# Patient Record
Sex: Male | Born: 1960 | Race: Black or African American | Hispanic: No | Marital: Single | Smoking: Current some day smoker
Health system: Southern US, Community
[De-identification: ages and names within clinical notes are randomized; demographics above are authoritative.]

## PROBLEM LIST (undated history)

## (undated) DIAGNOSIS — Z972 Presence of dental prosthetic device (complete) (partial): Secondary | ICD-10-CM

## (undated) DIAGNOSIS — K219 Gastro-esophageal reflux disease without esophagitis: Secondary | ICD-10-CM

## (undated) HISTORY — DX: Gastro-esophageal reflux disease without esophagitis: K21.9

## (undated) HISTORY — PX: ESOPHAGOGASTRODUODENOSCOPY: SHX1529

## (undated) HISTORY — PX: HERNIA REPAIR: SHX51

---

## 2000-10-14 ENCOUNTER — Encounter: Payer: Self-pay | Admitting: *Deleted

## 2000-10-14 ENCOUNTER — Inpatient Hospital Stay (HOSPITAL_COMMUNITY): Admission: AD | Admit: 2000-10-14 | Discharge: 2000-10-18 | Payer: Self-pay | Admitting: *Deleted

## 2000-10-15 ENCOUNTER — Encounter: Payer: Self-pay | Admitting: *Deleted

## 2001-11-22 ENCOUNTER — Emergency Department (HOSPITAL_COMMUNITY): Admission: EM | Admit: 2001-11-22 | Discharge: 2001-11-22 | Payer: Self-pay

## 2003-09-27 ENCOUNTER — Ambulatory Visit (HOSPITAL_COMMUNITY): Admission: RE | Admit: 2003-09-27 | Discharge: 2003-09-27 | Payer: Self-pay | Admitting: Pulmonary Disease

## 2004-05-05 ENCOUNTER — Ambulatory Visit: Payer: Self-pay | Admitting: Family Medicine

## 2004-05-09 ENCOUNTER — Ambulatory Visit (HOSPITAL_COMMUNITY): Admission: RE | Admit: 2004-05-09 | Discharge: 2004-05-09 | Payer: Self-pay | Admitting: Family Medicine

## 2004-08-20 ENCOUNTER — Ambulatory Visit: Payer: Self-pay | Admitting: Family Medicine

## 2005-04-20 ENCOUNTER — Ambulatory Visit: Payer: Self-pay | Admitting: Gastroenterology

## 2005-05-14 IMAGING — US US ABDOMEN COMPLETE
1 series · 14 of 25 positions shown · non-contrast
Comparison: none

HISTORY: Abdominal pain, nausea

ULTRASOUND ABDOMEN COMPLETE:
Gallbladder normal without stones or wall thickening.
Common bile duct normal caliber, 4 mm diameter.
Liver and spleen normal appearance.
Pancreas obscured by bowel gas.
Kidneys normal appearance, right 10.4 cm length and left 10.7 cm.
Aorta and IVC normal.
No ascites.

[Series 1: unknown · 0.40mm/px · 14 of 64 slices shown]
[im 1/64]
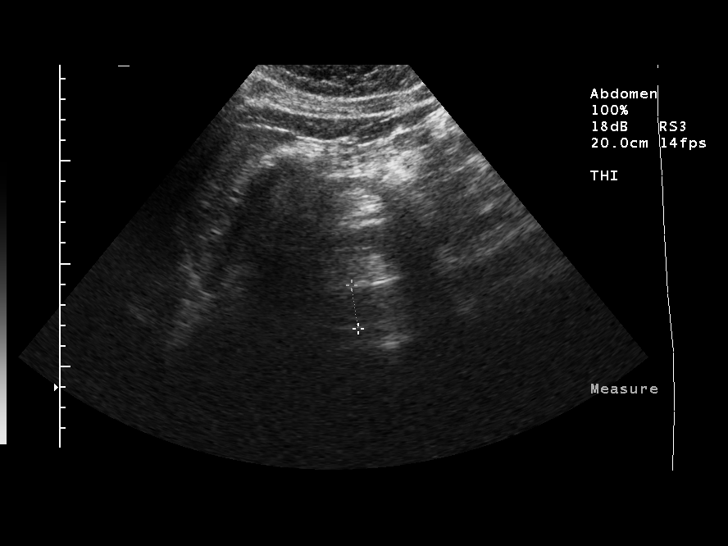
[im 6/64]
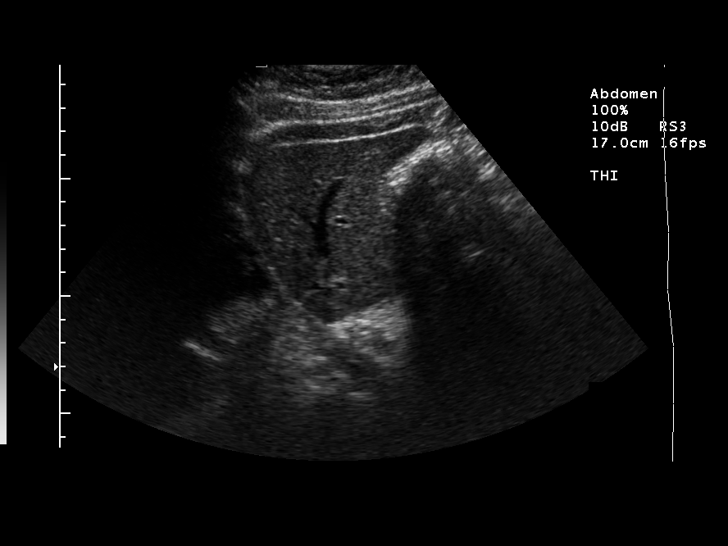
[im 11/64]
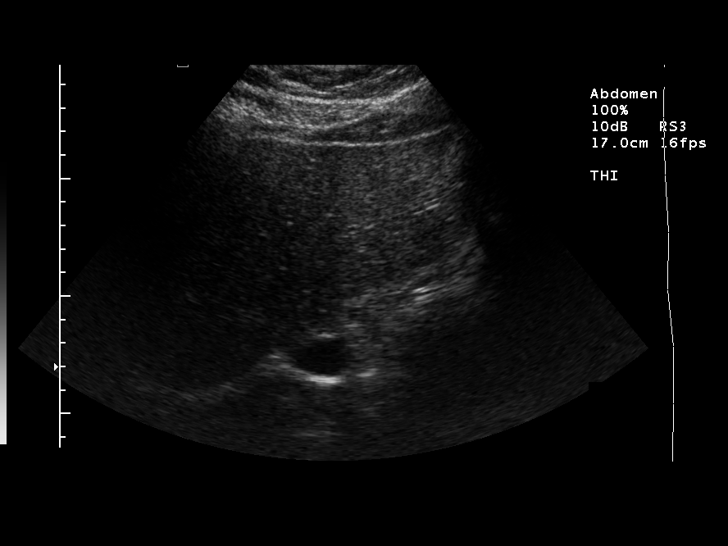
[im 16/64]
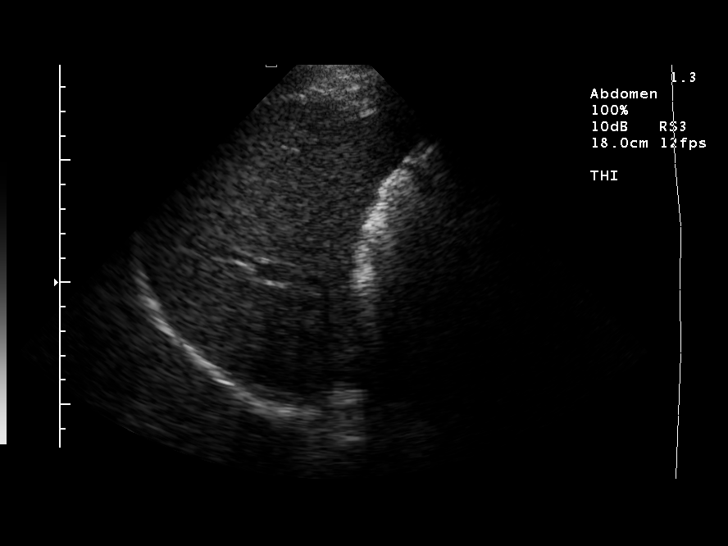
[im 22/64]
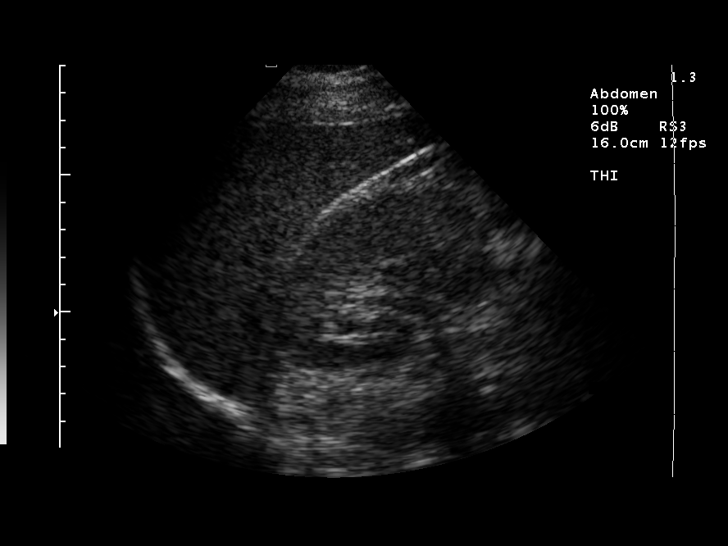
[im 24/64]
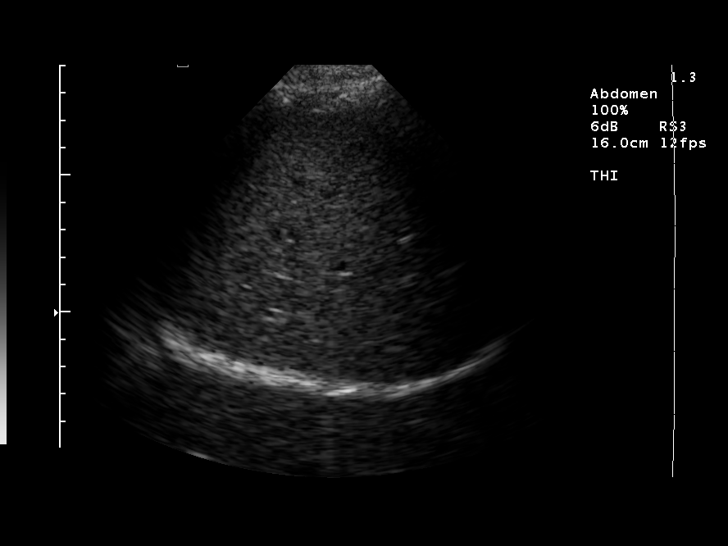
[im 29/64]
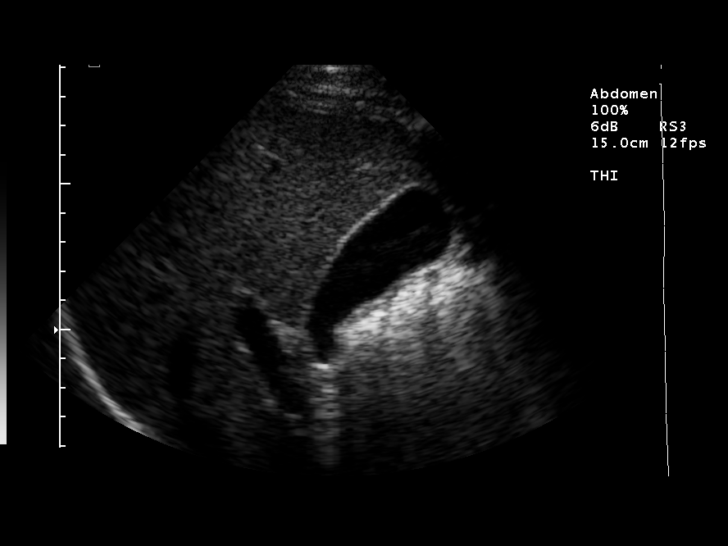
[im 35/64]
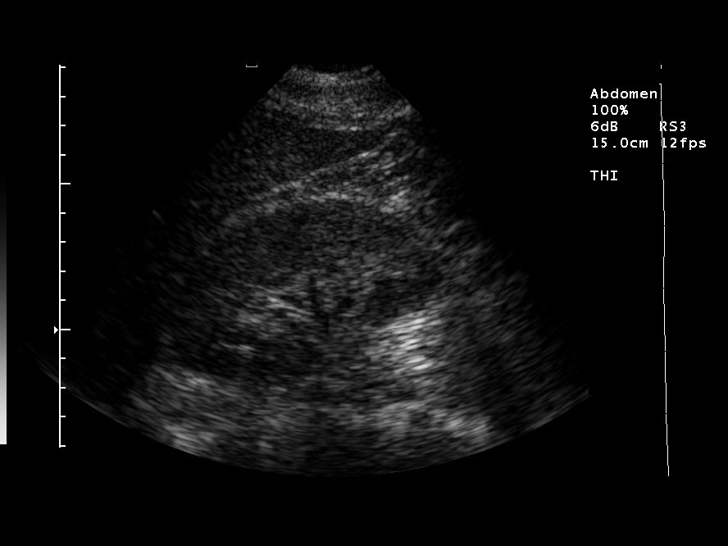
[im 40/64]
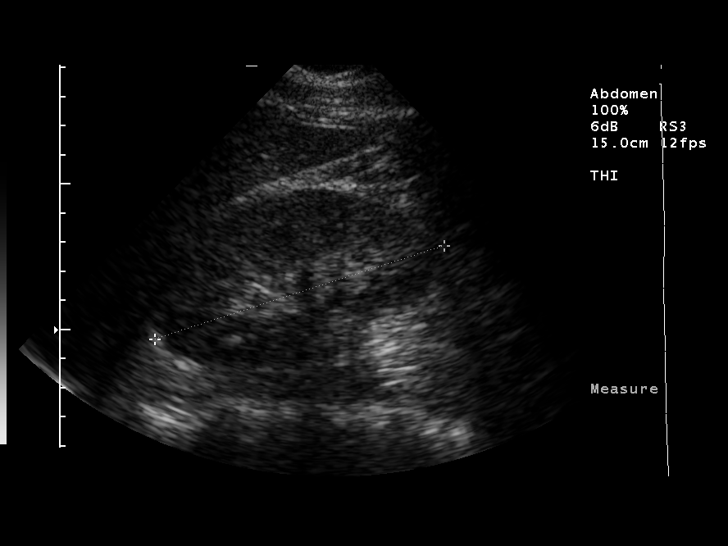
[im 43/64]
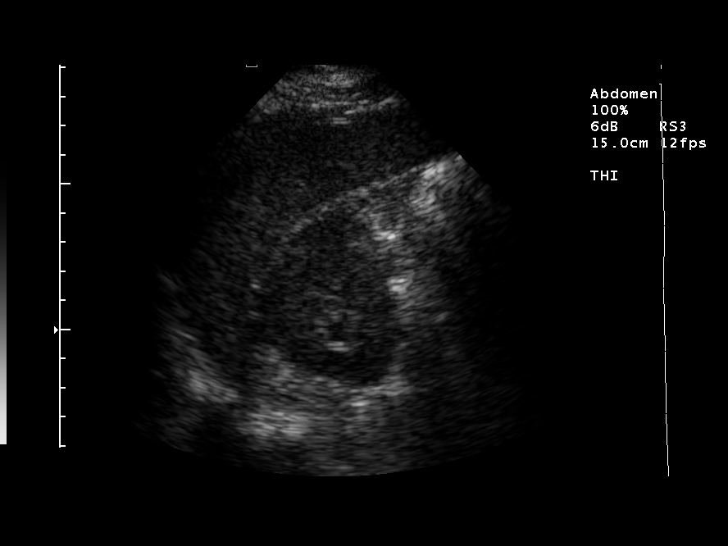
[im 48/64]
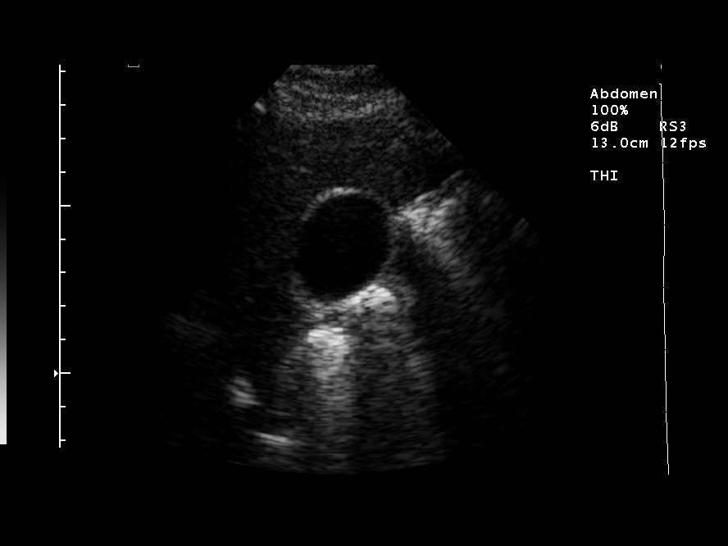
[im 53/64]
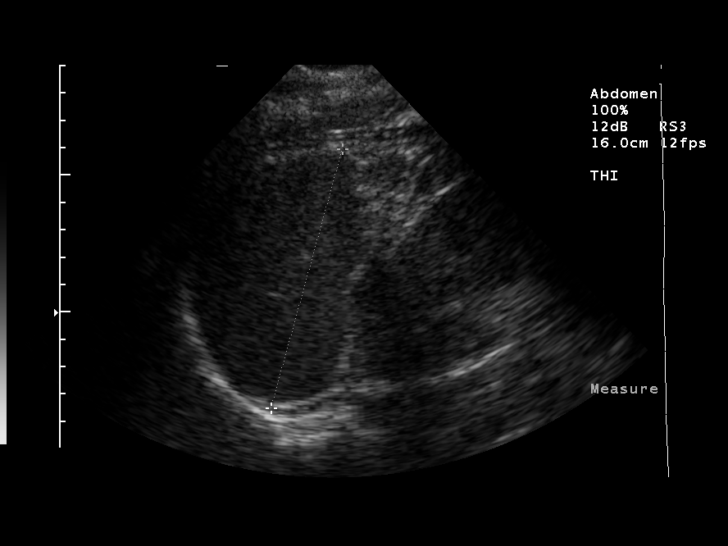
[im 58/64]
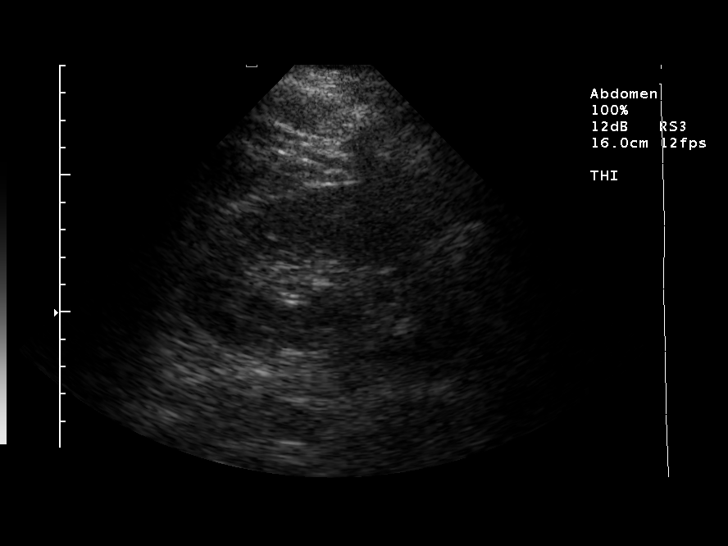
[im 64/64]
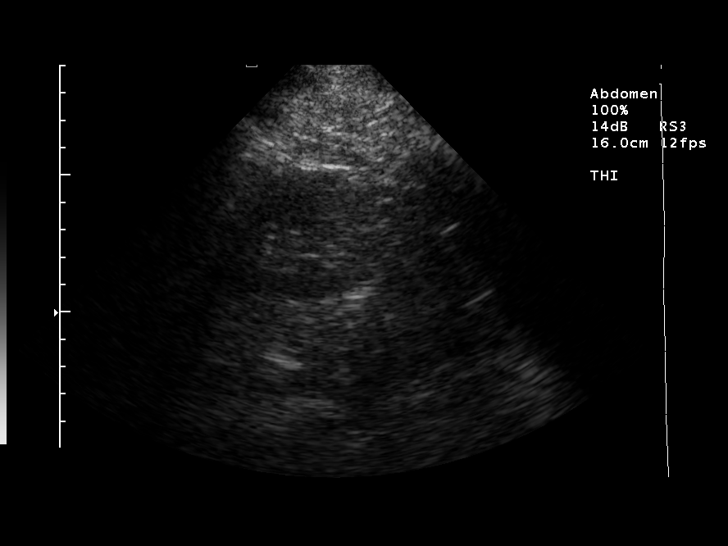

[14 of 25 positions shown; findings below may reference images not displayed]

IMPRESSION: Normal ultrasound abdomen.

## 2006-03-30 ENCOUNTER — Ambulatory Visit: Payer: Self-pay | Admitting: Family Medicine

## 2007-06-09 ENCOUNTER — Encounter: Payer: Self-pay | Admitting: Family Medicine

## 2008-04-20 ENCOUNTER — Encounter: Payer: Self-pay | Admitting: Family Medicine

## 2008-05-22 ENCOUNTER — Ambulatory Visit: Payer: Self-pay | Admitting: Family Medicine

## 2008-05-22 LAB — CONVERTED CEMR LAB: OCCULT 1: NEGATIVE

## 2008-05-23 DIAGNOSIS — E669 Obesity, unspecified: Secondary | ICD-10-CM | POA: Insufficient documentation

## 2008-05-23 DIAGNOSIS — B353 Tinea pedis: Secondary | ICD-10-CM | POA: Insufficient documentation

## 2008-05-23 DIAGNOSIS — F528 Other sexual dysfunction not due to a substance or known physiological condition: Secondary | ICD-10-CM | POA: Insufficient documentation

## 2008-05-23 LAB — CONVERTED CEMR LAB
BUN: 16 mg/dL (ref 6–23)
Calcium: 8.9 mg/dL (ref 8.4–10.5)
Cholesterol: 209 mg/dL — ABNORMAL HIGH (ref 0–200)
Creatinine, Ser: 1.22 mg/dL (ref 0.40–1.50)
Eosinophils Absolute: 0.1 10*3/uL (ref 0.0–0.7)
Eosinophils Relative: 2 % (ref 0–5)
Glucose, Bld: 75 mg/dL (ref 70–99)
HCT: 44.5 % (ref 39.0–52.0)
Hemoglobin: 15.1 g/dL (ref 13.0–17.0)
Lymphs Abs: 2.2 10*3/uL (ref 0.7–4.0)
MCV: 86.6 fL (ref 78.0–100.0)
Monocytes Absolute: 0.5 10*3/uL (ref 0.1–1.0)
PSA: 0.52 ng/mL (ref 0.10–4.00)
Platelets: 236 10*3/uL (ref 150–400)
Sodium: 139 meq/L (ref 135–145)
TSH: 1.85 microintl units/mL (ref 0.350–4.50)
Total CHOL/HDL Ratio: 4.4
WBC: 6.2 10*3/uL (ref 4.0–10.5)

## 2010-06-28 ENCOUNTER — Encounter: Payer: Self-pay | Admitting: Family Medicine

## 2010-06-29 ENCOUNTER — Encounter: Payer: Self-pay | Admitting: Family Medicine

## 2010-07-08 NOTE — Letter (Signed)
Summary: Historic Patient File  Historic Patient File   Imported By: Lind Guest 12/11/2009 13:44:01  _____________________________________________________________________  External Attachment:    Type:   Image     Comment:   External Document

## 2010-10-24 NOTE — Discharge Summary (Signed)
[place]  Patient:    Curtis Rowe, Curtis Rowe                        MRN: [id number] Adm. Date:  [id number] Disc. Date: [id number] Attending:  Glennon Hamilton Dictator:   Joellyn Rued, P.A.-C. CC:         Kari Baars, M.D.   Referring Physician Discharge [place]  DATE OF BIRTH:  [birth year]  ATTENDING PHYSICIAN:  Dr. Luis Abed.  BRIEF HISTORY:  Mr. Curtis Rowe is a 50 year old black male who was admitted from home after being referred from his primary M.D.  He describes a three-week history of a cough which has been nonproductive.  He has not had any fevers or chills and saw his primary care doctor for the previously mentioned symptoms; he was placed on antibiotic for 10 days with little improvement.  He also describes a five-week history of chest discomfort.  He describes this as left-sided and sharp without radiation.  It has occurred at work and [employer].  It seems to improve with activity and it only lasts a few seconds.  He may not have it at all during the day, or he may have it three times during the day.  He denies any associated shortness of breath, nausea, vomiting, diaphoresis.  He gives it a scale of 1-2/0-10, nor is it pleuritic.  He has not had any injuries.  He denies any exertional symptoms, fatigue, orthopnea, PND, edema, palpitations, syncope, GI or GU problems, weight loss, decreased appetite.  He does have occasional indigestion which is relieved with TUMS.  LABORATORY DATA:  Fasting lipids show a total cholesterol of 229, triglycerides 63, HDL 58, LDL 158.  CKs and troponins were negative for myocardial infarction.  Sodium was 136, potassium 3.6, BUN 14, creatinine 1.2. Normal LFTs.  H&H were 14.8 and 42.5, normal indices, platelets 198,000, WBC 9.5.  PT 13.1 and PTT 26.  Chest x-ray did not show any active disease.  EKG shows normal sinus rhythm, nonspecific ST-T wave changes.  Echocardiogram showed an EF of 55-65%, no  wall motion abnormalities.  Right ventricular size was the upper limits of normal.  HOSPITAL COURSE:  Mr. Curtis Rowe was admitted to 3700.  It was felt that his symptoms were atypical.  Overnight, he did not have any further discomfort. Enzymes and EKGs were negative for myocardial infarction.  A stress Cardiolite was performed.  He ambulated into stage IV and achieved greater than an 85% predicted maximum heart rate.  The test was stopped due to fatigue.  He did not have any chest discomfort.  He did not have any EKG changes.  Imaging showed an EF of 57%, inferoseptal ischemia.  Dr. Madolyn Frieze. Crenshaw reviewed imaging and agreed with the radiologists interpretation.  Cardiac catheterization was recommended.  Catheterization performed on Oct 18, 2000 did not show any evidence of coronary artery disease.  His ejection fraction was 50%.  Dr. Everardo Beals. Juanda Chance felt that his symptoms were not cardiac but probably related to his cough and that he had had a false-positive stress test.  Post sheath removal and bedrest, he was ambulating in the halls without difficulty and catheterization site was intact, thus, he was discharged home.  DISCHARGE DIAGNOSES: 1. Chest discomfort of undetermined etiology.  Cardiac catheterization did not    show any coronary artery disease and he has normal left ventricular    function. 2. Hyperlipidemia. 3. Persistent cough.  DISPOSITION:  He is discharged home.  DISCHARGE MEDICATIONS:  We are giving him a new prescription for Protonix 40 mg q.d. and Humibid LA 600 mg b.i.d.  ACTIVITY:  He was advised no lifting, driving, sexual activity or heavy exertion for two days.  DIET:  Maintain low-salt/-fat/-cholesterol diet.  WOUND CARE:  If he has any problems with his catheterization site, he was asked to call us immediately.  FOLLOWUP:  He will arrange a followup appointment with Dr. Kari Baars, who will determine if he needs to be on a lipid agent or if he can  correct his hyperlipidemia with diet. DD:  10/18/00 TD:  10/18/00 Job: [id number]

## 2010-10-24 NOTE — Cardiovascular Report (Signed)
[place]. [facility]  Patient:    Curtis Rowe, Curtis Rowe                        MRN: [id number] Proc. Date: 10/18/00 Adm. Date:  [id number] Attending:  Glennon Hamilton CC:         Cardiac Catheterization Laboatory  Kari Baars, M.D.  Cecil Cranker, M.D. [facility]   Cardiac Catheterization  PROCEDURE:  Cardiac catheterization.  CARDIOLOGISTEverardo Beals Juanda Chance, M.D.  INDICATIONS:  Curtis Rowe is 50 years old and has had recent symptoms of an upper respiratory infection with cough.  He developed left-sided chest pain and was seen by Dr. Kari Baars, and referred to [place] for further evaluation. Dr. Cecil Cranker saw him in consultation here, and thought his pain was atypical for ischemia, but ordered an echocardiogram and a stress Cardiolite. The stress Cardiolite was abnormal with inferoseptal ischemia, and so he was scheduled for a cardiac catheterization.  His echocardiogram was normal.  DESCRIPTION OF PROCEDURE:  The procedure was performed via the right femoral artery and arterial sheath and 6-French preformed coronary catheters.  A frontal arterial puncture was performed, and Omnipaque contrast was used.  The patient tolerated the procedure well and left the laboratory in satisfactory condition.  RESULTS: PRESSURES: Aortic pressure:  114/72, with a mean of 91. Left ventricular pressure:  114/11.  1. Left main coronary artery:  The left main coronary artery was free    of significant disease. 2. Left anterior descending coronary artery:  The left anterior descending    coronary artery gave rise to two diagonal branches and four septal    perforators.  These the LAD proper were free of significant disease. 3. Circumflex coronary artery:  The circumflex coronary artery gave rise    to an intermediate branch, a marginal branch, and two posterolateral    branches.  These vessels were free of significant disease. 4. Right coronary artery:  The right coronary  artery is a moderate-sized    vessel and gave rise to a conus branch, right ventricular branch, a    posterior descending branch, and a small posterolateral branch.  This    vessel was free of significant disease.  LEFT VENTRICULOGRAM:  The left ventriculogram performed in the RAO projection showed mild global hypokinesis with an estimated ejection fraction of 50%.  CONCLUSION:  Normal coronary angiography and left ventricular wall motion.  RECOMMENDATIONS:  Reassurance.  In view of these findings, I suspect his symptoms are probably muscular based, possibly secondary to his recent coughing with his upper respiratory infection.  Will plan reassurance and will plan discharge later today. DD:  10/18/00 TD:  10/18/00 Job: [id number]

## 2013-08-01 ENCOUNTER — Telehealth: Payer: Self-pay

## 2013-08-01 ENCOUNTER — Other Ambulatory Visit: Payer: Self-pay

## 2013-08-01 DIAGNOSIS — Z1211 Encounter for screening for malignant neoplasm of colon: Secondary | ICD-10-CM

## 2013-08-01 NOTE — Telephone Encounter (Signed)
Gastroenterology Pre-Procedure Review  Request Date: 08/01/2013 Requesting Physician: Dr. Theodis ShoveJunell Rowe  [facility]( [facility] )  PATIENT REVIEW QUESTIONS: The patient responded to the following health history questions as indicated:    1. Diabetes Melitis: no 2. Joint replacements in the past 12 months: no 3. Major health problems in the past 3 months: no 4. Has an artificial valve or MVP: no 5. Has a defibrillator: no 6. Has been advised in past to take antibiotics in advance of a procedure like teeth cleaning: no    MEDICATIONS & ALLERGIES:    Patient reports the following regarding taking any blood thinners:   Plavix? no Aspirin? no Coumadin? no  Patient confirms/reports the following medications:  No current outpatient prescriptions on file.   No current facility-administered medications for this visit.    Patient confirms/reports the following allergies:  No Known Allergies  No orders of the defined types were placed in this encounter.    AUTHORIZATION INFORMATION Primary Insurance:   ID #:   Group #:  Pre-Cert / Auth required: Pre-Cert / Auth #:   Secondary Insurance:  ID #:   Group #:  Pre-Cert / Auth required:  Pre-Cert / Auth #:   SCHEDULE INFORMATION: Procedure has been scheduled as follows:  Date: 08/18/2013   Time:  8:30 AM Location: [facility] Short Stay  This Gastroenterology Pre-Precedure Review Form is being routed to the following provider(s): R. Roetta SessionsMichael Rourk, MD

## 2013-08-01 NOTE — Telephone Encounter (Signed)
Appropriate.

## 2013-08-02 ENCOUNTER — Encounter (HOSPITAL_COMMUNITY): Payer: Self-pay | Admitting: Pharmacy Technician

## 2013-08-08 MED ORDER — PEG-KCL-NACL-NASULF-NA ASC-C 100 G PO SOLR: 1.0000 | ORAL | Status: DC

## 2013-08-08 NOTE — Telephone Encounter (Signed)
Rx sent to the pharmacy and instructions mailed to pt.  

## 2013-08-15 NOTE — Telephone Encounter (Signed)
Pt rescheduled his procedure from 08/18/2013 to 09/22/2013 at 7:30 AM. Selena BattenKim is aware and new instructions with new date/time has been mailed to pt.

## 2013-08-18 ENCOUNTER — Telehealth: Payer: Self-pay | Admitting: Gastroenterology

## 2013-08-18 NOTE — Telephone Encounter (Signed)
[facility] with [facility] on [facility]17. He is wanting to have it done on Easter Monday so he want have to take time off work. Please call him back at [phone]

## 2013-08-23 ENCOUNTER — Telehealth: Payer: Self-pay

## 2013-08-23 NOTE — Telephone Encounter (Signed)
I called [employer] at [phone] and per the automation, PA NOT REQUIRED for screening colonoscopy.

## 2013-08-24 ENCOUNTER — Telehealth: Payer: Self-pay | Admitting: Internal Medicine

## 2013-08-24 NOTE — Telephone Encounter (Signed)
I spoke to pt and he is checking his schedule and will call me back this afternoon. Dr. Jena Gaussourk will not be doing procedures on Easter Mon.

## 2013-08-24 NOTE — Telephone Encounter (Signed)
Pt would like to have his appt 08/31/13 at 1:00 pm.  I cancelled his appt on 4/17 and put him on the schedule for 3/26.  Doris, please mail the patient's instructions and send in Rx.

## 2013-08-24 NOTE — Telephone Encounter (Signed)
See note of 08/24/2013.

## 2013-08-24 NOTE — Telephone Encounter (Signed)
I called Florian BuffKim Faint and had her move his appt from 4/17 to 3/26

## 2013-08-24 NOTE — Telephone Encounter (Signed)
Pt LMOM that he had left message once before on 08/18/13 that he wanted to rsc his tcs and had a certain day in mind to have it on, but no one has gotten back with him. I transferred call to DS VM and did another phone note.

## 2013-08-25 NOTE — Telephone Encounter (Signed)
New instructions mailed to the pt. Rx had been previously sent.

## 2013-08-30 ENCOUNTER — Telehealth: Payer: Self-pay | Admitting: *Deleted

## 2013-08-30 ENCOUNTER — Telehealth: Payer: Self-pay | Admitting: Internal Medicine

## 2013-08-30 NOTE — Telephone Encounter (Signed)
Pt called today stating he is going out of town today for a funeral and he needs to cancel his colonoscopy for tomorrow. Please advise [phone]

## 2013-08-30 NOTE — Telephone Encounter (Signed)
Pt is scheduled to have tcs on 3/26 with [facility], but he had a death in the family and [facility]

## 2013-08-31 ENCOUNTER — Ambulatory Visit (HOSPITAL_COMMUNITY)
Admission: RE | Admit: 2013-08-31 | Payer: Managed Care, Other (non HMO) | Source: Ambulatory Visit | Admitting: Internal Medicine

## 2013-08-31 ENCOUNTER — Encounter (HOSPITAL_COMMUNITY): Admission: RE | Payer: Self-pay | Source: Ambulatory Visit

## 2013-08-31 SURGERY — COLONOSCOPY
Anesthesia: Moderate Sedation

## 2013-08-31 NOTE — Telephone Encounter (Signed)
See other phone note of 08/30/2013.

## 2013-08-31 NOTE — Telephone Encounter (Signed)
I called pt. He is trying to get a Friday, and he asked about 09/29/2013 and I told him that Dr. Jena Gaussourk is not available and he would like to let him know when he can do a Friday and it has to be every other week from 09/29/2013.

## 2013-09-20 ENCOUNTER — Telehealth: Payer: Self-pay

## 2013-09-20 ENCOUNTER — Other Ambulatory Visit: Payer: Self-pay

## 2013-09-20 DIAGNOSIS — Z1211 Encounter for screening for malignant neoplasm of colon: Secondary | ICD-10-CM

## 2013-09-20 NOTE — Telephone Encounter (Signed)
Computers went out while I was typing.  Pt had been scheduled for colonoscopy in March and had to cancel.   He has been rescheduled for 11/03/2017 and is on my update triage list.

## 2013-09-20 NOTE — Telephone Encounter (Signed)
Pt had been scheduled colonoscopy in [place]

## 2013-09-20 NOTE — Telephone Encounter (Signed)
Pt has rescheduled to 11/03/2013. Triage will need to be updated and he is on my update list.  See separate triage for today.

## 2013-09-20 NOTE — Telephone Encounter (Signed)
Appropriate.

## 2013-09-20 NOTE — Telephone Encounter (Signed)
Gastroenterology Pre-Procedure Review  Request Date: 09/20/2013 Requesting Physician: Dr. Ethlyn Gallery at [place]: The patient responded to the following health history questions as indicated:    1. Diabetes Melitis: no 2. Joint replacements in the past 12 months: no 3. Major health problems in the past 3 months: no 4. Has an artificial valve or MVP: no 5. Has a defibrillator: no 6. Has been advised in past to take antibiotics in advance of a procedure like teeth cleaning: no    MEDICATIONS & ALLERGIES:    Patient reports the following regarding taking any blood thinners:   Plavix? no Aspirin? no Coumadin? no  Patient confirms/reports the following medications:  Current Outpatient Prescriptions  Medication Sig Dispense Refill  . peg 3350 powder (MOVIPREP) 100 G SOLR Take 1 kit (200 g total) by mouth as directed.  1 kit  0   No current facility-administered medications for this visit.    Patient confirms/reports the following allergies:  No Known Allergies  No orders of the defined types were placed in this encounter.    AUTHORIZATION INFORMATION Primary Insurance:   ID #:   Group #:  Pre-Cert / Auth required:  Pre-Cert / Auth #:   Secondary Insurance:   ID #:   Group #:  Pre-Cert / Auth required: Pre-Cert / Auth #:   SCHEDULE INFORMATION: Procedure has been scheduled as follows:  Date:11/03/2013              Time: 7:30 AM  Location: [facility] Short Stay  This Gastroenterology Pre-Precedure Review Form is being routed to the following provider(s): R. Garfield Cornea, MD

## 2013-09-25 NOTE — Telephone Encounter (Signed)
Instructions mailed to pt. ( Rx had been sent in previously).

## 2013-10-25 ENCOUNTER — Telehealth: Payer: Self-pay

## 2013-10-25 NOTE — Telephone Encounter (Signed)
I called and LMOM for a return call to update triage info prior to procedure on 11/03/2013.

## 2013-11-02 ENCOUNTER — Telehealth: Payer: Self-pay

## 2013-11-02 NOTE — Telephone Encounter (Signed)
Curtis Rowe called and said that the pt had called the hospital this morning and cancelled his procedure tomorrow, said that he could not get off of wor. I called pt to reschedule. He is trying to work out his schedule. He is going to have to work out something and he is to call me back shortly.

## 2013-11-02 NOTE — Telephone Encounter (Signed)
Pt cancelled. See phone note of 11/02/2013.

## 2013-11-03 ENCOUNTER — Encounter (HOSPITAL_COMMUNITY): Admission: RE | Payer: Self-pay | Source: Ambulatory Visit

## 2013-11-03 ENCOUNTER — Ambulatory Visit (HOSPITAL_COMMUNITY)
Admission: RE | Admit: 2013-11-03 | Payer: Managed Care, Other (non HMO) | Source: Ambulatory Visit | Admitting: Internal Medicine

## 2013-11-03 SURGERY — COLONOSCOPY
Anesthesia: Moderate Sedation

## 2015-08-30 DIAGNOSIS — M20002 Unspecified deformity of left finger(s): Secondary | ICD-10-CM | POA: Insufficient documentation

## 2015-08-30 DIAGNOSIS — Z8249 Family history of ischemic heart disease and other diseases of the circulatory system: Secondary | ICD-10-CM | POA: Insufficient documentation

## 2017-11-26 ENCOUNTER — Encounter: Payer: Self-pay | Admitting: Family Medicine

## 2017-11-26 ENCOUNTER — Ambulatory Visit: Payer: 59 | Admitting: Family Medicine

## 2017-11-26 VITALS — BP 140/92 | HR 80 | Ht 74.0 in | Wt 245.0 lb

## 2017-11-26 DIAGNOSIS — K219 Gastro-esophageal reflux disease without esophagitis: Secondary | ICD-10-CM | POA: Diagnosis not present

## 2017-11-26 DIAGNOSIS — Z7689 Persons encountering health services in other specified circumstances: Secondary | ICD-10-CM

## 2017-11-26 DIAGNOSIS — S63642D Sprain of metacarpophalangeal joint of left thumb, subsequent encounter: Secondary | ICD-10-CM | POA: Diagnosis not present

## 2017-11-26 MED ORDER — PANTOPRAZOLE SODIUM 40 MG PO TBEC: 40.0000 mg | DELAYED_RELEASE_TABLET | Freq: Every day | ORAL | 3 refills | Status: DC

## 2017-11-26 NOTE — Progress Notes (Signed)
Name: Curtis Rowe   MRN: [id number]    DOB: 1960-08-07   Date:11/26/2017       Progress Note  Subjective  Chief Complaint  Chief Complaint  Patient presents with  . Establish Care    just need a pcp  . thumb pain    L) thumb pain    Patient presents for establishment of primary care.   Hand Injury   The incident occurred more than 1 week ago (1986). The incident occurred at the gym. Injury mechanism: diect strike to surface. The pain is present in the left fingers (left thumb). The quality of the pain is described as aching. The pain is moderate. The pain has been constant since the incident. Pertinent negatives include no chest pain, muscle weakness, numbness or tingling. The symptoms are aggravated by movement.    No problem-specific Assessment & Plan notes found for this encounter.   Past Medical History:  Diagnosis Date  . GERD (gastroesophageal reflux disease)     Past Surgical History:  Procedure Laterality Date  . HERNIA REPAIR      Family History  Problem Relation Age of Onset  . Diabetes Mother   . Heart disease Mother   . Hypertension Mother   . Stroke Maternal Grandfather     Social History   Socioeconomic History  . Marital status: Single    Spouse name: Not on file  . Number of children: Not on file  . Years of education: Not on file  . Highest education level: Not on file  Occupational History  . Not on file  Social Needs  . Financial resource strain: Not on file  . Food insecurity:    Worry: Not on file    Inability: Not on file  . Transportation needs:    Medical: Not on file    Non-medical: Not on file  Tobacco Use  . Smoking status: Current Some Day Smoker    Types: Cigars  . Smokeless tobacco: Never Used  . Tobacco comment: info given on pills and patches  Substance and Sexual Activity  . Alcohol use: Yes  . Drug use: Never  . Sexual activity: Yes  Lifestyle  . Physical activity:    Days per week: Not on file    Minutes per  session: Not on file  . Stress: Not on file  Relationships  . Social connections:    Talks on phone: Not on file    Gets together: Not on file    Attends religious service: Not on file    Active member of club or organization: Not on file    Attends meetings of clubs or organizations: Not on file    Relationship status: Not on file  . Intimate partner violence:    Fear of current or ex partner: Not on file    Emotionally abused: Not on file    Physically abused: Not on file    Forced sexual activity: Not on file  Other Topics Concern  . Not on file  Social History Narrative  . Not on file    No Known Allergies  Outpatient Medications Prior to Visit  Medication Sig Dispense Refill  . peg 3350 powder (MOVIPREP) 100 G SOLR Take 1 kit (200 g total) by mouth as directed. 1 kit 0   No facility-administered medications prior to visit.     Review of Systems  Constitutional: Negative for chills, fever, malaise/fatigue and weight loss.  HENT: Negative for ear discharge, ear pain and  sore throat.   Eyes: Negative for blurred vision.  Respiratory: Negative for cough, sputum production, shortness of breath and wheezing.   Cardiovascular: Negative for chest pain, palpitations and leg swelling.  Gastrointestinal: Negative for abdominal pain, blood in stool, constipation, diarrhea, heartburn, melena and nausea.  Genitourinary: Negative for dysuria, frequency, hematuria and urgency.  Musculoskeletal: Negative for back pain, joint pain, myalgias and neck pain.  Skin: Negative for rash.  Neurological: Negative for dizziness, tingling, sensory change, focal weakness, numbness and headaches.  Endo/Heme/Allergies: Negative for environmental allergies and polydipsia. Does not bruise/bleed easily.  Psychiatric/Behavioral: Negative for depression and suicidal ideas. The patient is not nervous/anxious and does not have insomnia.      Objective  Vitals:   11/26/17 1440  BP: (!) 140/92  Pulse:  80  Weight: 245 lb (111.1 kg)  Height: '6\' 2"'  (1.88 m)    Physical Exam  Constitutional: He is oriented to person, place, and time.  HENT:  Head: Normocephalic.  Right Ear: External ear normal.  Left Ear: External ear normal.  Nose: Nose normal.  Mouth/Throat: Oropharynx is clear and moist.  Eyes: Pupils are equal, round, and reactive to light. Conjunctivae and EOM are normal. Right eye exhibits no discharge. Left eye exhibits no discharge. No scleral icterus.  Neck: Normal range of motion. Neck supple. No JVD present. No tracheal deviation present. No thyromegaly present.  Cardiovascular: Normal rate, regular rhythm, normal heart sounds and intact distal pulses. Exam reveals no gallop and no friction rub.  No murmur heard. Pulmonary/Chest: Breath sounds normal. No respiratory distress. He has no wheezes. He has no rales.  Abdominal: Soft. Bowel sounds are normal. He exhibits no mass. There is no hepatosplenomegaly. There is no tenderness. There is no rebound, no guarding and no CVA tenderness.  Musculoskeletal: Normal range of motion. He exhibits no edema.       Left hand: He exhibits tenderness and deformity. He exhibits normal range of motion.       Hands: Exam c/w ruptured collateral ligament left thumb  Lymphadenopathy:    He has no cervical adenopathy.  Neurological: He is alert and oriented to person, place, and time. He has normal strength and normal reflexes. No cranial nerve deficit.  Skin: Skin is warm. No rash noted.  Nursing note and vitals reviewed.     Assessment & Plan  Problem List Items Addressed This Visit    None    Visit Diagnoses    Establishing care with new doctor, encounter for    -  Primary   establish care and set up a physical for 2 weeks   Rupture of ulnar collateral ligament of left thumb, subsequent encounter       Chronic injury now with emerging discomfort. Initial treatment meloxicam 15 mg daily next step willsend to ortho for further eval and  treatment   Relevant Orders   Ambulatory referral to Orthopedic Surgery   Gastroesophageal reflux disease, esophagitis presence not specified       start on protonix 40 mg.   Relevant Medications   pantoprazole (PROTONIX) 40 MG tablet      Meds ordered this encounter  Medications  . pantoprazole (PROTONIX) 40 MG tablet    Sig: Take 1 tablet (40 mg total) by mouth daily.    Dispense:  30 tablet    Refill:  3      Dr. [facility]  11/26/17

## 2017-11-26 NOTE — Patient Instructions (Signed)
Ulnar Collateral Ligament Injury of the Thumb A ligament is a strong band of tissue that connects and supports bones. Ulnar collateral ligament (UCL) injury happens when the UCL at the base of the thumb is stretched or torn. A tear can be either partial or complete. The severity of the injury depends on how much of the ligament was damaged or torn. The UCL ligament is important for normal use of the thumb. This ligament helps you to use and move your thumb. UCL injury can happen suddenly (acuteinjury) or gradually (chronic injury) with repeated overstretching of the ligament. If it is not treated properly, UCL injury can lead to arthritis. What are the causes? This injury is caused by forcefully moving the thumb past its normal range of motion toward the wrist. If you extend your hands to catch an object or to protect yourself while falling, the force of the impact can cause your ligament to stretch too much. This excess tension can also cause your ligament to tear. What increases the risk? This injury is more likely to occur in:  People who have had a previous thumb injury or sprain.  People who play contact sports or sports that involve catching balls, such as baseball, basketball, or football.  People who do activities that increase the chance that the thumb will be pulled away from the rest of the hand.  People who have poor hand strength and flexibility.  People who do not warm up properly before activities.  What are the signs or symptoms? Symptoms of this injury include:  Pain or tenderness over the injured area with movement of the thumb.  Pain when the injured area is pressed.  Bruising or redness at the base of the thumb. This can spread to the whole thumb and part of the hand.  Swelling over the injured area.  Difficulty grasping or pinching with the injured thumb due to weakness or pain.  If the injury is severe, a lump (mass) may be felt under the skin in the injured  area. How is this diagnosed? This injury is diagnosed with a medical history and physical exam. You may also have imaging studies, including:  X-ray.  Ultrasound.  MRI.  How is this treated? Treatment varies depending on the severity of your injury. If the UCL is overstretched or partially torn, treatment usually involves keeping your thumb in a fixed position (immobilization) for a period of time. To help you do this, your health care provider will apply a brace, cast, or splint to keep your thumb from moving until it heals. If the UCL is fully torn, you may need surgery to reconnect the ligament to the bone. After surgery, a cast or splint will be applied and it will need to stay on your thumb while it heals. Your health care provider may also suggest exercises or physical therapy to strengthen your thumb. Follow these instructions at home: If you have a cast:  Do not stick anything inside the cast to scratch your skin. Doing that increases your risk of infection.  Check the skin around the cast every day. Report any concerns to your health care provider. You may put lotion on dry skin around the edges of the cast. Do not apply lotion to the skin underneath the cast.  Keep the cast clean and dry. If you have a splint or brace:  Wear it as told by your health care provider. Remove it only as told by your health care provider.  Loosen it if  your fingers become numb and tingle, or if they turn cold and blue.  Keep the brace or splint clean and dry. Bathing  Cover the cast or splint and bandage (dressing) with a watertight plastic bag to protect it from water while you take a bath or shower. Do not let the cast or splint and dressing get wet. Managing pain, stiffness, and swelling  If directed, apply ice to the injured area: ? Put ice in a plastic bag. ? Place a towel between your skin and the bag. ? Leave the ice on for 20 minutes, 2-3 times per day.  Move your fingers often to  avoid stiffness and to lessen swelling.  Raise (elevate) the injured area above the level of your heart while you are sitting or lying down. Driving  Do not drive or operate heavy machinery while taking prescription pain medicine.  Ask your health care provider when it is safe to drive if you have a cast, splint, or brace on your hand. General instructions  Do not put pressure on any part of your cast or splint until it is fully hardened. This may take several hours.  Take over-the-counter and prescription medicines only as told by your health care provider.  Keep all follow-up visits as told by your health care provider. This is important.  Do not wear rings on your injured thumb.  Do any exercise or physical therapy as told by your health care provider. Contact a health care provider if:  Your pain is not controlled with medicine.  Your bruising or swelling gets worse.  Your cast or splint is damaged.  Your thumb is numb or blue.  Your thumb feels colder than normal. This information is not intended to replace advice given to you by your health care provider. Make sure you discuss any questions you have with your health care provider. Document Released: 05/25/2005 Document Revised: 01/26/2016 Document Reviewed: 08/01/2014 Elsevier Interactive Patient Education  [employer].

## 2017-12-06 ENCOUNTER — Ambulatory Visit (INDEPENDENT_AMBULATORY_CARE_PROVIDER_SITE_OTHER): Payer: 59 | Admitting: Family Medicine

## 2017-12-06 ENCOUNTER — Encounter: Payer: Self-pay | Admitting: Family Medicine

## 2017-12-06 VITALS — BP 144/100 | HR 84 | Ht 74.0 in | Wt 246.0 lb

## 2017-12-06 DIAGNOSIS — Z23 Encounter for immunization: Secondary | ICD-10-CM | POA: Diagnosis not present

## 2017-12-06 DIAGNOSIS — R030 Elevated blood-pressure reading, without diagnosis of hypertension: Secondary | ICD-10-CM

## 2017-12-06 DIAGNOSIS — Z0000 Encounter for general adult medical examination without abnormal findings: Secondary | ICD-10-CM

## 2017-12-06 DIAGNOSIS — Z1211 Encounter for screening for malignant neoplasm of colon: Secondary | ICD-10-CM | POA: Diagnosis not present

## 2017-12-06 LAB — HEMOCCULT GUIAC POC 1CARD (OFFICE): FECAL OCCULT BLD: NEGATIVE

## 2017-12-06 NOTE — Progress Notes (Signed)
Name: Curtis Rowe   MRN: [id number]    DOB: April 06, 1961   Date:12/06/2017       Progress Note  Subjective  Chief Complaint  Chief Complaint  Patient presents with  . Annual Exam    needs TDAP and colonoscopy    Patient is a 57 year old male who presents for a comprehensive physical exam. The patient reports the following problems: weight concern/and BP. Health maintenance has been reviewed will give tetanus and schedule colonoscopy.   No problem-specific Assessment & Plan notes found for this encounter.   Past Medical History:  Diagnosis Date  . GERD (gastroesophageal reflux disease)     Past Surgical History:  Procedure Laterality Date  . HERNIA REPAIR      Family History  Problem Relation Age of Onset  . Diabetes Mother   . Heart disease Mother   . Hypertension Mother   . Stroke Maternal Grandfather     Social History   Socioeconomic History  . Marital status: Single    Spouse name: Not on file  . Number of children: Not on file  . Years of education: Not on file  . Highest education level: Not on file  Occupational History  . Not on file  Social Needs  . Financial resource strain: Not on file  . Food insecurity:    Worry: Not on file    Inability: Not on file  . Transportation needs:    Medical: Not on file    Non-medical: Not on file  Tobacco Use  . Smoking status: Current Some Day Smoker    Types: Cigars  . Smokeless tobacco: Never Used  . Tobacco comment: info given on pills and patches  Substance and Sexual Activity  . Alcohol use: Yes  . Drug use: Never  . Sexual activity: Yes  Lifestyle  . Physical activity:    Days per week: Not on file    Minutes per session: Not on file  . Stress: Not on file  Relationships  . Social connections:    Talks on phone: Not on file    Gets together: Not on file    Attends religious service: Not on file    Active member of club or organization: Not on file    Attends meetings of clubs or organizations:  Not on file    Relationship status: Not on file  . Intimate partner violence:    Fear of current or ex partner: Not on file    Emotionally abused: Not on file    Physically abused: Not on file    Forced sexual activity: Not on file  Other Topics Concern  . Not on file  Social History Narrative  . Not on file    No Known Allergies  Outpatient Medications Prior to Visit  Medication Sig Dispense Refill  . pantoprazole (PROTONIX) 40 MG tablet Take 1 tablet (40 mg total) by mouth daily. 30 tablet 3   No facility-administered medications prior to visit.     Review of Systems  Constitutional: Negative for chills, fever, malaise/fatigue and weight loss.  HENT: Negative for ear discharge, ear pain and sore throat.   Eyes: Negative for blurred vision.  Respiratory: Negative for cough, sputum production, shortness of breath and wheezing.   Cardiovascular: Negative for chest pain, palpitations and leg swelling.  Gastrointestinal: Negative for abdominal pain, blood in stool, constipation, diarrhea, heartburn, melena and nausea.  Genitourinary: Negative for dysuria, frequency, hematuria and urgency.  Musculoskeletal: Negative for back pain,  joint pain, myalgias and neck pain.  Skin: Negative for rash.  Neurological: Negative for dizziness, tingling, sensory change, focal weakness and headaches.  Endo/Heme/Allergies: Negative for environmental allergies and polydipsia. Does not bruise/bleed easily.  Psychiatric/Behavioral: Negative for depression and suicidal ideas. The patient is not nervous/anxious and does not have insomnia.      Objective  Vitals:   12/06/17 0838  BP: (!) 144/100  Pulse: 84  Weight: 246 lb (111.6 kg)  Height: 6\' 2"  (1.88 m)    Physical Exam  Constitutional: He is oriented to person, place, and time. Vital signs are normal. He appears well-developed and well-nourished.  HENT:  Head: Normocephalic and atraumatic.  Right Ear: Hearing, tympanic membrane, external  ear and ear canal normal.  Left Ear: Hearing, tympanic membrane, external ear and ear canal normal.  Nose: Nose normal. No mucosal edema.  Mouth/Throat: Uvula is midline and oropharynx is clear and moist. Normal dentition. No oropharyngeal exudate, posterior oropharyngeal edema or posterior oropharyngeal erythema.  Eyes: Pupils are equal, round, and reactive to light. Conjunctivae, EOM and lids are normal. Right eye exhibits no discharge. Left eye exhibits no discharge. No scleral icterus.  Fundoscopic exam:      The right eye shows no arteriolar narrowing and no AV nicking.       The left eye shows no arteriolar narrowing and no AV nicking.  Neck: Trachea normal and normal range of motion. Neck supple. Normal carotid pulses, no hepatojugular reflux and no JVD present. No tracheal tenderness present. Carotid bruit is not present. No tracheal deviation present. No thyroid mass and no thyromegaly present.  Cardiovascular: Normal rate, regular rhythm, S1 normal, S2 normal, normal heart sounds, intact distal pulses and normal pulses. PMI is not displaced. Exam reveals no gallop, no S3, no S4 and no friction rub.  No murmur heard.  No systolic murmur is present.  No diastolic murmur is present. Pulmonary/Chest: Effort normal and breath sounds normal. No respiratory distress. He has no decreased breath sounds. He has no wheezes. He has no rhonchi. He has no rales. Right breast exhibits no mass. Left breast exhibits no mass.  Abdominal: Soft. Normal appearance and bowel sounds are normal. He exhibits no mass. There is no hepatosplenomegaly. There is no tenderness. There is no rebound, no guarding and no CVA tenderness. Hernia confirmed negative in the right inguinal area and confirmed negative in the left inguinal area.  Genitourinary: Rectum normal, prostate normal, testes normal and penis normal. Rectal exam shows guaiac negative stool. Prostate is not enlarged and not tender. Circumcised.   Musculoskeletal: Normal range of motion. He exhibits no edema or tenderness.       Lumbar back: Normal.  Lymphadenopathy:       Head (right side): No submental and no submandibular adenopathy present.       Head (left side): No submental and no submandibular adenopathy present.    He has no cervical adenopathy.    He has no axillary adenopathy.  Neurological: He is alert and oriented to person, place, and time. He has normal strength. No cranial nerve deficit or sensory deficit.  Reflex Scores:      Tricep reflexes are 1+ on the right side and 1+ on the left side.      Bicep reflexes are 1+ on the right side and 1+ on the left side.      Brachioradialis reflexes are 1+ on the right side and 1+ on the left side.      Patellar reflexes  are 1+ on the right side and 1+ on the left side.      Achilles reflexes are 1+ on the right side and 1+ on the left side. Skin: Skin is warm, dry and intact. No rash noted. He is not diaphoretic. No pallor.  Nursing note and vitals reviewed.     Assessment & Plan  Problem List Items Addressed This Visit    None    Visit Diagnoses    Annual physical exam    -  Primary   No subjective /objective concerns. Baseline renal/lipid panels.   Relevant Orders   Lipid panel   Elevated blood pressure reading without diagnosis of hypertension       Patient desires weight loss and DASH diet approach fist Will intiate and rechech Bp in 6-8 weeks.   Relevant Orders   Renal Function Panel   Lipid panel   Colon cancer screening       Hemoccult negative. will refer for colonoscopy.   Relevant Orders   Ambulatory referral to Gastroenterology   POCT Occult Blood Stool (Completed)   Need for diphtheria-tetanus-pertussis (Tdap) vaccine       Tdap administered IM.   Relevant Orders   Tdap vaccine greater than or equal to 7yo IM (Completed)      No orders of the defined types were placed in this encounter.     Dr. Hayden [facility]  12/06/17

## 2017-12-06 NOTE — Patient Instructions (Signed)
DASH Eating Plan DASH stands for "Dietary Approaches to Stop Hypertension." The DASH eating plan is a healthy eating plan that has been shown to reduce high blood pressure (hypertension). It may also reduce your risk for type 2 diabetes, heart disease, and stroke. The DASH eating plan may also help with weight loss. What are tips for following this plan? General guidelines  Avoid eating more than 2,300 mg (milligrams) of salt (sodium) a day. If you have hypertension, you may need to reduce your sodium intake to 1,500 mg a day.  Limit alcohol intake to no more than 1 drink a day for nonpregnant women and 2 drinks a day for men. One drink equals 12 oz of beer, 5 oz of wine, or 1 oz of hard liquor.  Work with your health care provider to maintain a healthy body weight or to lose weight. Ask what an ideal weight is for you.  Get at least 30 minutes of exercise that causes your heart to beat faster (aerobic exercise) most days of the week. Activities may include walking, swimming, or biking.  Work with your health care provider or diet and nutrition specialist (dietitian) to adjust your eating plan to your individual calorie needs. Reading food labels  Check food labels for the amount of sodium per serving. Choose foods with less than 5 percent of the Daily Value of sodium. Generally, foods with less than 300 mg of sodium per serving fit into this eating plan.  To find whole grains, look for the word "whole" as the first word in the ingredient list. Shopping  Buy products labeled as "low-sodium" or "no salt added."  Buy fresh foods. Avoid canned foods and premade or frozen meals. Cooking  Avoid adding salt when cooking. Use salt-free seasonings or herbs instead of table salt or sea salt. Check with your health care provider or pharmacist before using salt substitutes.  Do not fry foods. Cook foods using healthy methods such as baking, boiling, grilling, and broiling instead.  Cook with  heart-healthy oils, such as olive, canola, soybean, or sunflower oil. Meal planning   Eat a balanced diet that includes: ? 5 or more servings of fruits and vegetables each day. At each meal, try to fill half of your plate with fruits and vegetables. ? Up to 6-8 servings of whole grains each day. ? Less than 6 oz of lean meat, poultry, or fish each day. A 3-oz serving of meat is about the same size as a deck of cards. One egg equals 1 oz. ? 2 servings of low-fat dairy each day. ? A serving of nuts, seeds, or beans 5 times each week. ? Heart-healthy fats. Healthy fats called Omega-3 fatty acids are found in foods such as flaxseeds and coldwater fish, like sardines, salmon, and mackerel.  Limit how much you eat of the following: ? Canned or prepackaged foods. ? Food that is high in trans fat, such as fried foods. ? Food that is high in saturated fat, such as fatty meat. ? Sweets, desserts, sugary drinks, and other foods with added sugar. ? Full-fat dairy products.  Do not salt foods before eating.  Try to eat at least 2 vegetarian meals each week.  Eat more home-cooked food and less restaurant, buffet, and fast food.  When eating at a restaurant, ask that your food be prepared with less salt or no salt, if possible. What foods are recommended? The items listed may not be a complete list. Talk with your dietitian about what   dietary choices are best for you. Grains Whole-grain or whole-wheat bread. Whole-grain or whole-wheat pasta. Brown rice. Oatmeal. Quinoa. Bulgur. Whole-grain and low-sodium cereals. Pita bread. Low-fat, low-sodium crackers. Whole-wheat flour tortillas. Vegetables Fresh or frozen vegetables (raw, steamed, roasted, or grilled). Low-sodium or reduced-sodium tomato and vegetable juice. Low-sodium or reduced-sodium tomato sauce and tomato paste. Low-sodium or reduced-sodium canned vegetables. Fruits All fresh, dried, or frozen fruit. Canned fruit in natural juice (without  added sugar). Meat and other protein foods Skinless chicken or turkey. Ground chicken or turkey. Pork with fat trimmed off. Fish and seafood. Egg whites. Dried beans, peas, or lentils. Unsalted nuts, nut butters, and seeds. Unsalted canned beans. Lean cuts of beef with fat trimmed off. Low-sodium, lean deli meat. Dairy Low-fat (1%) or fat-free (skim) milk. Fat-free, low-fat, or reduced-fat cheeses. Nonfat, low-sodium ricotta or cottage cheese. Low-fat or nonfat yogurt. Low-fat, low-sodium cheese. Fats and oils Soft margarine without trans fats. Vegetable oil. Low-fat, reduced-fat, or light mayonnaise and salad dressings (reduced-sodium). Canola, safflower, olive, soybean, and sunflower oils. Avocado. Seasoning and other foods Herbs. Spices. Seasoning mixes without salt. Unsalted popcorn and pretzels. Fat-free sweets. What foods are not recommended? The items listed may not be a complete list. Talk with your dietitian about what dietary choices are best for you. Grains Baked goods made with fat, such as croissants, muffins, or some breads. Dry pasta or rice meal packs. Vegetables Creamed or fried vegetables. Vegetables in a cheese sauce. Regular canned vegetables (not low-sodium or reduced-sodium). Regular canned tomato sauce and paste (not low-sodium or reduced-sodium). Regular tomato and vegetable juice (not low-sodium or reduced-sodium). Pickles. Olives. Fruits Canned fruit in a light or heavy syrup. Fried fruit. Fruit in cream or butter sauce. Meat and other protein foods Fatty cuts of meat. Ribs. Fried meat. Bacon. Sausage. Bologna and other processed lunch meats. Salami. Fatback. Hotdogs. Bratwurst. Salted nuts and seeds. Canned beans with added salt. Canned or smoked fish. Whole eggs or egg yolks. Chicken or turkey with skin. Dairy Whole or 2% milk, cream, and half-and-half. Whole or full-fat cream cheese. Whole-fat or sweetened yogurt. Full-fat cheese. Nondairy creamers. Whipped toppings.  Processed cheese and cheese spreads. Fats and oils Butter. Stick margarine. Lard. Shortening. Ghee. Bacon fat. Tropical oils, such as coconut, palm kernel, or palm oil. Seasoning and other foods Salted popcorn and pretzels. Onion salt, garlic salt, seasoned salt, table salt, and sea salt. Worcestershire sauce. Tartar sauce. Barbecue sauce. Teriyaki sauce. Soy sauce, including reduced-sodium. Steak sauce. Canned and packaged gravies. Fish sauce. Oyster sauce. Cocktail sauce. Horseradish that you find on the shelf. Ketchup. Mustard. Meat flavorings and tenderizers. Bouillon cubes. Hot sauce and Tabasco sauce. Premade or packaged marinades. Premade or packaged taco seasonings. Relishes. Regular salad dressings. Where to find more information:  National Heart, Lung, and Blood Institute: www.nhlbi.nih.gov  American Heart Association: www.heart.org Summary  The DASH eating plan is a healthy eating plan that has been shown to reduce high blood pressure (hypertension). It may also reduce your risk for type 2 diabetes, heart disease, and stroke.  With the DASH eating plan, you should limit salt (sodium) intake to 2,300 mg a day. If you have hypertension, you may need to reduce your sodium intake to 1,500 mg a day.  When on the DASH eating plan, aim to eat more fresh fruits and vegetables, whole grains, lean proteins, low-fat dairy, and heart-healthy fats.  Work with your health care provider or diet and nutrition specialist (dietitian) to adjust your eating plan to your individual   calorie needs. This information is not intended to replace advice given to you by your health care provider. Make sure you discuss any questions you have with your health care provider. Document Released: 05/14/2011 Document Revised: 05/18/2016 Document Reviewed: 05/18/2016 Elsevier Interactive Patient Education  2018 [employer]  

## 2017-12-07 LAB — RENAL FUNCTION PANEL
ALBUMIN: 4.3 g/dL (ref 3.5–5.5)
BUN/Creatinine Ratio: 12 (ref 9–20)
BUN: 14 mg/dL (ref 6–24)
CALCIUM: 8.9 mg/dL (ref 8.7–10.2)
CHLORIDE: 104 mmol/L (ref 96–106)
CO2: 24 mmol/L (ref 20–29)
Creatinine, Ser: 1.17 mg/dL (ref 0.76–1.27)
GFR calc non Af Amer: 70 mL/min/{1.73_m2} (ref >59)
GFR, EST AFRICAN AMERICAN: 81 mL/min/{1.73_m2} (ref >59)
GLUCOSE: 81 mg/dL (ref 65–99)
POTASSIUM: 3.9 mmol/L (ref 3.5–5.2)
Phosphorus: 2 mg/dL — ABNORMAL LOW (ref 2.5–4.5)
Sodium: 140 mmol/L (ref 134–144)

## 2017-12-07 LAB — LIPID PANEL
CHOLESTEROL TOTAL: 169 mg/dL (ref 100–199)
Chol/HDL Ratio: 3.2 ratio (ref 0.0–5.0)
HDL: 53 mg/dL (ref >39)
LDL Calculated: 103 mg/dL — ABNORMAL HIGH (ref 0–99)
Triglycerides: 64 mg/dL (ref 0–149)
VLDL Cholesterol Cal: 13 mg/dL (ref 5–40)

## 2017-12-27 ENCOUNTER — Other Ambulatory Visit: Payer: Self-pay

## 2017-12-27 DIAGNOSIS — Z1211 Encounter for screening for malignant neoplasm of colon: Secondary | ICD-10-CM

## 2018-01-21 ENCOUNTER — Ambulatory Visit: Payer: 59 | Admitting: Family Medicine

## 2018-01-27 ENCOUNTER — Encounter: Payer: Self-pay | Admitting: *Deleted

## 2018-01-27 ENCOUNTER — Other Ambulatory Visit: Payer: Self-pay

## 2018-01-31 ENCOUNTER — Telehealth: Payer: Self-pay | Admitting: Gastroenterology

## 2018-01-31 ENCOUNTER — Other Ambulatory Visit: Payer: Self-pay

## 2018-01-31 MED ORDER — NA SULFATE-K SULFATE-MG SULF 17.5-3.13-1.6 GM/177ML PO SOLN: 1.0000 | ORAL | 0 refills | Status: DC

## 2018-01-31 NOTE — Telephone Encounter (Signed)
PT LEFT VM HE STATES HE HAS PROCEDURE Friday WITH DR. Servando SnareWOHL AND HAS NOT RECEIVED INSTRUCTIONS OR RX , PLEASE SEND RX TO [facility] AND CALL PT WITH INSTRUCTIONS

## 2018-02-01 NOTE — Telephone Encounter (Signed)
Email with instructions have been sent to pt. [facility]

## 2018-02-03 NOTE — Discharge Instructions (Signed)
General Anesthesia, Adult, Care After °These instructions provide you with information about caring for yourself after your procedure. Your health care provider may also give you more specific instructions. Your treatment has been planned according to current medical practices, but problems sometimes occur. Call your health care provider if you have any problems or questions after your procedure. °What can I expect after the procedure? °After the procedure, it is common to have: °· Vomiting. °· A sore throat. °· Mental slowness. ° °It is common to feel: °· Nauseous. °· Cold or shivery. °· Sleepy. °· Tired. °· Sore or achy, even in parts of your body where you did not have surgery. ° °Follow these instructions at home: °For at least 24 hours after the procedure: °· Do not: °? Participate in activities where you could fall or become injured. °? Drive. °? Use heavy machinery. °? Drink alcohol. °? Take sleeping pills or medicines that cause drowsiness. °? Make important decisions or sign legal documents. °? Take care of children on your own. °· Rest. °Eating and drinking °· If you vomit, drink water, juice, or soup when you can drink without vomiting. °· Drink enough fluid to keep your urine clear or pale yellow. °· Make sure you have little or no nausea before eating solid foods. °· Follow the diet recommended by your health care provider. °General instructions °· Have a responsible adult stay with you until you are awake and alert. °· Return to your normal activities as told by your health care provider. Ask your health care provider what activities are safe for you. °· Take over-the-counter and prescription medicines only as told by your health care provider. °· If you smoke, do not smoke without supervision. °· Keep all follow-up visits as told by your health care provider. This is important. °Contact a health care provider if: °· You continue to have nausea or vomiting at home, and medicines are not helpful. °· You  cannot drink fluids or start eating again. °· You cannot urinate after 8-12 hours. °· You develop a skin rash. °· You have fever. °· You have increasing redness at the site of your procedure. °Get help right away if: °· You have difficulty breathing. °· You have chest pain. °· You have unexpected bleeding. °· You feel that you are having a life-threatening or urgent problem. °This information is not intended to replace advice given to you by your health care provider. Make sure you discuss any questions you have with your health care provider. °Document Released: 08/31/2000 Document Revised: 10/28/2015 Document Reviewed: 05/09/2015 °Elsevier Interactive Patient Education © 2018 [employer]. ° °

## 2018-02-04 ENCOUNTER — Ambulatory Visit: Payer: 59 | Admitting: Anesthesiology

## 2018-02-04 ENCOUNTER — Ambulatory Visit
Admission: RE | Admit: 2018-02-04 | Discharge: 2018-02-04 | Disposition: A | Discharge status: Home / Self Care | Payer: 59 | Source: Ambulatory Visit | Attending: Gastroenterology | Admitting: Gastroenterology

## 2018-02-04 ENCOUNTER — Encounter
Admission: RE | Disposition: A | Discharge status: Home / Self Care | Payer: Self-pay | Source: Ambulatory Visit | Attending: Gastroenterology

## 2018-02-04 DIAGNOSIS — F17290 Nicotine dependence, other tobacco product, uncomplicated: Secondary | ICD-10-CM | POA: Insufficient documentation

## 2018-02-04 DIAGNOSIS — K640 First degree hemorrhoids: Secondary | ICD-10-CM | POA: Insufficient documentation

## 2018-02-04 DIAGNOSIS — Z79899 Other long term (current) drug therapy: Secondary | ICD-10-CM | POA: Diagnosis not present

## 2018-02-04 DIAGNOSIS — Z1211 Encounter for screening for malignant neoplasm of colon: Secondary | ICD-10-CM | POA: Diagnosis not present

## 2018-02-04 DIAGNOSIS — Z8249 Family history of ischemic heart disease and other diseases of the circulatory system: Secondary | ICD-10-CM | POA: Diagnosis not present

## 2018-02-04 DIAGNOSIS — K219 Gastro-esophageal reflux disease without esophagitis: Secondary | ICD-10-CM | POA: Insufficient documentation

## 2018-02-04 DIAGNOSIS — Z833 Family history of diabetes mellitus: Secondary | ICD-10-CM | POA: Insufficient documentation

## 2018-02-04 DIAGNOSIS — Z823 Family history of stroke: Secondary | ICD-10-CM | POA: Insufficient documentation

## 2018-02-04 DIAGNOSIS — K621 Rectal polyp: Secondary | ICD-10-CM | POA: Diagnosis not present

## 2018-02-04 HISTORY — PX: COLONOSCOPY WITH PROPOFOL: SHX5780

## 2018-02-04 HISTORY — PX: POLYPECTOMY: SHX5525

## 2018-02-04 HISTORY — DX: Presence of dental prosthetic device (complete) (partial): Z97.2

## 2018-02-04 SURGERY — COLONOSCOPY WITH PROPOFOL
Anesthesia: General | Wound class: Clean Contaminated

## 2018-02-04 MED ORDER — LACTATED RINGERS IV SOLN
INTRAVENOUS | Status: DC
  Administered 2018-02-04: 11:00:00 via INTRAVENOUS

## 2018-02-04 MED ORDER — LIDOCAINE HCL (CARDIAC) PF 100 MG/5ML IV SOSY
PREFILLED_SYRINGE | INTRAVENOUS | Status: DC | PRN
  Administered 2018-02-04: 40 mg via INTRAVENOUS

## 2018-02-04 MED ORDER — STERILE WATER FOR IRRIGATION IR SOLN
Status: DC | PRN
  Administered 2018-02-04: 11:00:00

## 2018-02-04 MED ORDER — PROPOFOL 10 MG/ML IV BOLUS
INTRAVENOUS | Status: DC | PRN
  Administered 2018-02-04: 30 mg via INTRAVENOUS
  Administered 2018-02-04: 100 mg via INTRAVENOUS
  Administered 2018-02-04 (×2): 20 mg via INTRAVENOUS
  Administered 2018-02-04: 30 mg via INTRAVENOUS
  Administered 2018-02-04: 20 mg via INTRAVENOUS
  Administered 2018-02-04: 30 mg via INTRAVENOUS

## 2018-02-04 SURGICAL SUPPLY — 24 items
CANISTER SUCT 1200ML W/VALVE (MISCELLANEOUS) ×4 IMPLANT
CLIP HMST 235XBRD CATH ROT (MISCELLANEOUS) IMPLANT
CLIP RESOLUTION 360 11X235 (MISCELLANEOUS)
ELECT REM PT RETURN 9FT ADLT (ELECTROSURGICAL)
ELECTRODE REM PT RTRN 9FT ADLT (ELECTROSURGICAL) IMPLANT
FCP ESCP3.2XJMB 240X2.8X (MISCELLANEOUS) ×2
FORCEPS BIOP RAD 4 LRG CAP 4 (CUTTING FORCEPS) IMPLANT
FORCEPS BIOP RJ4 240 W/NDL (MISCELLANEOUS) ×2
FORCEPS ESCP3.2XJMB 240X2.8X (MISCELLANEOUS) ×2 IMPLANT
GOWN CVR UNV OPN BCK APRN NK (MISCELLANEOUS) ×4 IMPLANT
GOWN ISOL THUMB LOOP REG UNIV (MISCELLANEOUS) ×4
INJECTOR VARIJECT VIN23 (MISCELLANEOUS) IMPLANT
KIT DEFENDO VALVE AND CONN (KITS) IMPLANT
KIT ENDO PROCEDURE OLY (KITS) ×4 IMPLANT
MARKER SPOT ENDO TATTOO 5ML (MISCELLANEOUS) IMPLANT
PROBE APC STR FIRE (PROBE) IMPLANT
RETRIEVER NET ROTH 2.5X230 LF (MISCELLANEOUS) IMPLANT
SNARE SHORT THROW 13M SML OVAL (MISCELLANEOUS) IMPLANT
SNARE SHORT THROW 30M LRG OVAL (MISCELLANEOUS) IMPLANT
SNARE SNG USE RND 15MM (INSTRUMENTS) IMPLANT
SPOT EX ENDOSCOPIC TATTOO (MISCELLANEOUS)
TRAP ETRAP POLY (MISCELLANEOUS) IMPLANT
VARIJECT INJECTOR VIN23 (MISCELLANEOUS)
WATER STERILE IRR 250ML POUR (IV SOLUTION) ×4 IMPLANT

## 2018-02-04 NOTE — Op Note (Signed)
[facility] Gastroenterology Patient Name: Curtis Rowe Procedure Date: 02/04/2018 11:13 AM MRN: [id number] Account #: [id number] Date of Birth: 23-Jun-1960 Admit Type: Outpatient Age: 57 Room: [facility] OR ROOM 01 Gender: Male Note Status: Finalized Procedure:            Colonoscopy Indications:          Screening for colorectal malignant neoplasm Providers:            Midge Minium MD, MD Referring MD:         Duanne Limerick, MD (Referring MD) Medicines:            Propofol per Anesthesia Complications:        No immediate complications. Procedure:            Pre-Anesthesia Assessment:                       - Prior to the procedure, a History and Physical was                        performed, and patient medications and allergies were                        reviewed. The patient's tolerance of previous                        anesthesia was also reviewed. The risks and benefits of                        the procedure and the sedation options and risks were                        discussed with the patient. All questions were                        answered, and informed consent was obtained. Prior                        Anticoagulants: The patient has taken no previous                        anticoagulant or antiplatelet agents. ASA Grade                        Assessment: II - A patient with mild systemic disease.                        After reviewing the risks and benefits, the patient was                        deemed in satisfactory condition to undergo the                        procedure.                       After obtaining informed consent, the colonoscope was                        passed under direct vision. Throughout the procedure,  the patient's blood pressure, pulse, and oxygen                        saturations were monitored continuously. The was                        introduced through the anus and advanced to the the                  cecum, identified by appendiceal orifice and ileocecal                        valve. The colonoscopy was performed without                        difficulty. The patient tolerated the procedure well.                        The quality of the bowel preparation was excellent. Findings:      The perianal and digital rectal examinations were normal.      A 2 mm polyp was found in the rectum. The polyp was sessile. The polyp       was removed with a cold biopsy forceps. Resection and retrieval were       complete.      Non-bleeding internal hemorrhoids were found during retroflexion. The       hemorrhoids were Grade I (internal hemorrhoids that do not prolapse). Impression:           - One 2 mm polyp in the rectum, removed with a cold                        biopsy forceps. Resected and retrieved.                       - Non-bleeding internal hemorrhoids. Recommendation:       - Discharge patient to home.                       - Resume previous diet.                       - Repeat colonoscopy in 5 years if polyp adenoma and 10                        years if hyperplastic Procedure Code(s):    --- Professional ---                       [phone], Colonoscopy, flexible; with biopsy, single or                        multiple Diagnosis Code(s):    --- Professional ---                       Z12.11, Encounter for screening for malignant neoplasm                        of colon                       K62.1, Rectal polyp CPT copyright 2017 American Medical Association. All rights reserved.  The codes documented in this report are preliminary and upon coder review may  be revised to meet current compliance requirements. Midge Miniumarren Ervine Witucki MD, MD 02/04/2018 11:33:07 AM This report has been signed electronically. Number of Addenda: 0 Note Initiated On: 02/04/2018 11:13 AM Scope Withdrawal Time: 0 hours 5 minutes 44 seconds  Total Procedure Duration: 0 hours 9 minutes 39 seconds       [facility]

## 2018-02-04 NOTE — Anesthesia Postprocedure Evaluation (Signed)
Anesthesia Post Note  Patient: Curtis Rowe  Procedure(s) Performed: COLONOSCOPY WITH PROPOFOL (N/A ) POLYPECTOMY  Patient location during evaluation: PACU Anesthesia Type: General Level of consciousness: awake and alert Pain management: pain level controlled Vital Signs Assessment: post-procedure vital signs reviewed and stable Respiratory status: spontaneous breathing, nonlabored ventilation, respiratory function stable and patient connected to nasal cannula oxygen Cardiovascular status: blood pressure returned to baseline and stable Postop Assessment: no apparent nausea or vomiting Anesthetic complications: no    Alta CorningBacon, Curtis Taft S

## 2018-02-04 NOTE — Transfer of Care (Signed)
Immediate Anesthesia Transfer of Care Note  Patient: Curtis BrunswickSheldon L Rowe  Procedure(s) Performed: COLONOSCOPY WITH PROPOFOL (N/A )  Patient Location: PACU  Anesthesia Type: General  Level of Consciousness: awake, alert  and patient cooperative  Airway and Oxygen Therapy: Patient Spontanous Breathing and Patient connected to supplemental oxygen  Post-op Assessment: Post-op Vital signs reviewed, Patient's Cardiovascular Status Stable, Respiratory Function Stable, Patent Airway and No signs of Nausea or vomiting  Post-op Vital Signs: Reviewed and stable  Complications: No apparent anesthesia complications

## 2018-02-04 NOTE — Anesthesia Preprocedure Evaluation (Signed)
Anesthesia Evaluation  Patient identified by MRN, date of birth, ID band Patient awake    Reviewed: Allergy & Precautions, H&P , NPO status , Patient's Chart, lab work & pertinent test results, reviewed documented beta blocker date and time   Airway Mallampati: II  TM Distance: >3 FB Neck ROM: full    Dental no notable dental hx.    Pulmonary Current Smoker,    Pulmonary exam normal breath sounds clear to auscultation       Cardiovascular Exercise Tolerance: Good negative cardio ROS Normal cardiovascular exam Rhythm:regular Rate:Normal     Neuro/Psych negative neurological ROS  negative psych ROS   GI/Hepatic Neg liver ROS, GERD  Controlled,  Endo/Other  negative endocrine ROS  Renal/GU negative Renal ROS  negative genitourinary   Musculoskeletal   Abdominal   Peds  Hematology negative hematology ROS (+)   Anesthesia Other Findings   Reproductive/Obstetrics negative OB ROS                             Anesthesia Physical Anesthesia Plan  ASA: II  Anesthesia Plan: General   Post-op Pain Management:    Induction:   PONV Risk Score and Plan:   Airway Management Planned:   Additional Equipment:   Intra-op Plan:   Post-operative Plan:   Informed Consent: I have reviewed the patients History and Physical, chart, labs and discussed the procedure including the risks, benefits and alternatives for the proposed anesthesia with the patient or authorized representative who has indicated his/her understanding and acceptance.   Dental Advisory Given  Plan Discussed with: CRNA  Anesthesia Plan Comments:         Anesthesia Quick Evaluation

## 2018-02-04 NOTE — Anesthesia Procedure Notes (Signed)
Procedure Name: MAC Date/Time: 02/04/2018 11:17 AM Performed by: Janna Arch, CRNA Pre-anesthesia Checklist: Patient identified, Emergency Drugs available, Suction available and Patient being monitored Patient Re-evaluated:Patient Re-evaluated prior to induction Oxygen Delivery Method: Nasal cannula

## 2018-02-04 NOTE — H&P (Signed)
Lucilla Lame, MD [address] [place], [place] [address] Phone: [phone] Fax : [phone]  Primary Care Physician:  Juline Patch, MD Primary Gastroenterologist:  Dr. Allen Norris  Pre-Procedure History & Physical: HPI:  Curtis Rowe is a 57 y.o. male is here for a screening colonoscopy.   Past Medical History:  Diagnosis Date  . GERD (gastroesophageal reflux disease)   . Wears dentures    partial upper    Past Surgical History:  Procedure Laterality Date  . ESOPHAGOGASTRODUODENOSCOPY    . HERNIA REPAIR      Prior to Admission medications   Medication Sig Start Date End Date Taking? Authorizing Provider  Na Sulfate-K Sulfate-Mg Sulf (SUPREP BOWEL PREP KIT) 17.5-3.13-1.6 GM/177ML SOLN Take 1 kit by mouth as directed. 01/31/18   Lucilla Lame, MD  pantoprazole (PROTONIX) 40 MG tablet Take 1 tablet (40 mg total) by mouth daily. Patient not taking: Reported on 01/27/2018 11/26/17   Juline Patch, MD    Allergies as of 12/27/2017  . (No Known Allergies)    Family History  Problem Relation Age of Onset  . Diabetes Mother   . Heart disease Mother   . Hypertension Mother   . Stroke Maternal Grandfather     Social History   Socioeconomic History  . Marital status: Single    Spouse name: Not on file  . Number of children: Not on file  . Years of education: Not on file  . Highest education level: Not on file  Occupational History  . Not on file  Social Needs  . Financial resource strain: Not on file  . Food insecurity:    Worry: Not on file    Inability: Not on file  . Transportation needs:    Medical: Not on file    Non-medical: Not on file  Tobacco Use  . Smoking status: Current Some Day Smoker    Types: Cigars  . Smokeless tobacco: Never Used  . Tobacco comment: info given on pills and patches. 1 cigar/wk  Substance and Sexual Activity  . Alcohol use: Yes    Alcohol/week: 1.0 standard drinks    Types: 1 Shots of liquor per week  . Drug  use: Never  . Sexual activity: Yes  Lifestyle  . Physical activity:    Days per week: Not on file    Minutes per session: Not on file  . Stress: Not on file  Relationships  . Social connections:    Talks on phone: Not on file    Gets together: Not on file    Attends religious service: Not on file    Active member of club or organization: Not on file    Attends meetings of clubs or organizations: Not on file    Relationship status: Not on file  . Intimate partner violence:    Fear of current or ex partner: Not on file    Emotionally abused: Not on file    Physically abused: Not on file    Forced sexual activity: Not on file  Other Topics Concern  . Not on file  Social History Narrative  . Not on file    Review of Systems: See HPI, otherwise negative ROS  Physical Exam: BP (!) 157/81   Pulse 78   Temp 97.7 F (36.5 C) (Temporal)   Ht 6' 2" (1.88 m)   Wt 109.3 kg   SpO2 100%   BMI 30.94 kg/m  General:   Alert,  pleasant and cooperative  in NAD Head:  Normocephalic and atraumatic. Neck:  Supple; no masses or thyromegaly. Lungs:  Clear throughout to auscultation.    Heart:  Regular rate and rhythm. Abdomen:  Soft, nontender and nondistended. Normal bowel sounds, without guarding, and without rebound.   Neurologic:  Alert and  oriented x4;  grossly normal neurologically.  Impression/Plan: Curtis Rowe is now here to undergo a screening colonoscopy.  Risks, benefits, and alternatives regarding colonoscopy have been reviewed with the patient.  Questions have been answered.  All parties agreeable.

## 2018-02-09 ENCOUNTER — Encounter: Payer: Self-pay | Admitting: Gastroenterology

## 2018-02-10 ENCOUNTER — Encounter: Payer: Self-pay | Admitting: Gastroenterology

## 2018-05-18 ENCOUNTER — Ambulatory Visit
Admission: EM | Admit: 2018-05-18 | Discharge: 2018-05-18 | Disposition: A | Discharge status: Home / Self Care | Payer: 59 | Attending: Family Medicine | Admitting: Family Medicine

## 2018-05-18 ENCOUNTER — Other Ambulatory Visit: Payer: Self-pay

## 2018-05-18 DIAGNOSIS — H1031 Unspecified acute conjunctivitis, right eye: Secondary | ICD-10-CM

## 2018-05-18 DIAGNOSIS — H109 Unspecified conjunctivitis: Secondary | ICD-10-CM | POA: Insufficient documentation

## 2018-05-18 MED ORDER — ERYTHROMYCIN 5 MG/GM OP OINT: TOPICAL_OINTMENT | Freq: Every day | OPHTHALMIC | 0 refills | Status: AC

## 2018-05-18 NOTE — Discharge Instructions (Signed)
-  Use ophthalmic ointment as prescribed. -Practice good handwashing techniques and hygiene. -Follow-up as needed.

## 2018-05-18 NOTE — ED Triage Notes (Signed)
Patient complains of right eye redness, itching, burning x Sunday. Patient states that he has Herpes and is concerned this could be related.

## 2018-05-18 NOTE — ED Provider Notes (Signed)
[facility]    CSN: [id number] Arrival date & time: 05/18/18  1702  History   Chief Complaint Chief Complaint  Patient presents with  . Eye Pain    HPI Curtis Rowe is a 57 y.o. male.   HPI Patient presents today for a several day history of ongoing right eye pain, fullness and irritation.  The patient denies any sick contacts.  No injury or trauma to the right eye.  Denies any symptoms in the left eye.  He does report that he has woken up on several occasions with the right eye matted shut, he was initially light sensitive but denies any light sensitivity at today's appointment.  The patient is concerned as he does have a history of herpes and is concerned for involvement in the right eye.  He does feel that over the last 24 hours his symptoms in the right eye have improved.   Past Medical History:  Diagnosis Date  . GERD (gastroesophageal reflux disease)   . Wears dentures    partial upper    Patient Active Problem List   Diagnosis Date Noted  . Special screening for malignant neoplasms, colon   . Rectal polyp   . 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 Family history of early CAD 08/30/2015  . TINEA PEDIS 05/23/2008  . OBESITY, UNSPECIFIED 05/23/2008  . ERECTILE DYSFUNCTION 05/23/2008    Past Surgical History:  Procedure Laterality Date  . COLONOSCOPY WITH PROPOFOL N/A 02/04/2018   Procedure: COLONOSCOPY WITH PROPOFOL;  Surgeon: Lucilla Lame, MD;  Location: [facility];  Service: Endoscopy;  Laterality: N/A;  . ESOPHAGOGASTRODUODENOSCOPY    . HERNIA REPAIR    . POLYPECTOMY  02/04/2018   Procedure: POLYPECTOMY;  Surgeon: Lucilla Lame, MD;  Location: [facility];  Service: Endoscopy;;       Home Medications    Prior to Admission medications   Medication Sig Start Date End Date Taking? Authorizing Provider  erythromycin ophthalmic ointment Place into the right eye at bedtime for 10 days. Place a 1/2 inch ribbon of ointment into  the lower eyelid. 05/18/18 05/28/18  Lattie Corns, PA-C  Na Sulfate-K Sulfate-Mg Sulf (SUPREP BOWEL PREP KIT) 17.5-3.13-1.6 GM/177ML SOLN Take 1 kit by mouth as directed. 01/31/18   Lucilla Lame, MD  pantoprazole (PROTONIX) 40 MG tablet Take 1 tablet (40 mg total) by mouth daily. Patient not taking: Reported on 01/27/2018 11/26/17   Juline Patch, MD    Family History Family History  Problem Relation Age of Onset  . Diabetes Mother   . Heart disease Mother   . Hypertension Mother   . Stroke Maternal Grandfather     Social History Social History   Tobacco Use  . Smoking status: Current Some Day Smoker    Types: Cigars  . Smokeless tobacco: Never Used  . Tobacco comment: info given on pills and patches. 1 cigar/wk  Substance Use Topics  . Alcohol use: Yes    Alcohol/week: 1.0 standard drinks    Types: 1 Shots of liquor per week  . Drug use: Never     Allergies   Patient has no known allergies.   Review of Systems Review of Systems  Eyes: Positive for photophobia, pain, discharge, redness and itching. Negative for visual disturbance.  All other systems reviewed and are negative.    Physical Exam Triage Vital Signs ED Triage Vitals  Enc Vitals Group     BP 05/18/18 1718 (!) 162/100  Pulse Rate 05/18/18 1718 92     Resp 05/18/18 1718 18     Temp 05/18/18 1718 99.1 F (37.3 C)     Temp Source 05/18/18 1718 Oral     SpO2 05/18/18 1718 100 %     Weight 05/18/18 1717 235 lb (106.6 kg)     Height 05/18/18 1717 '6\' 2"'  (1.88 m)     Head Circumference --      Peak Flow --      Pain Score 05/18/18 1717 2     Pain Loc --      Pain Edu? --      Excl. in [place]? --    No data found.  Updated Vital Signs BP (!) 162/100 (BP Location: Left Arm)   Pulse 92   Temp 99.1 F (37.3 C) (Oral)   Resp 18   Ht '6\' 2"'  (1.88 m)   Wt 235 lb (106.6 kg)   SpO2 100%   BMI 30.17 kg/m   Visual Acuity Right Eye Distance: 20/25(corrected) Left Eye  Distance: 20/25(corrected) Bilateral Distance: 20/25 (corrected)  Right Eye Near:   Left Eye Near:    Bilateral Near:     Physical Exam  Constitutional: Vital signs are normal. He appears well-developed and well-nourished.  Eyes: Pupils are equal, round, and reactive to light. EOM and lids are normal. Lids are everted and swept, no foreign bodies found. Right conjunctiva is injected.  [place]s lamp examination of the right eye negative for any acute findings.  Neurological: He is alert.   UC Treatments / Results  Labs (all labs ordered are listed, but only abnormal results are displayed) Labs Reviewed - No data to display  EKG None  Radiology No results found.  Procedures Procedures (including critical care time)  Medications Ordered in UC Medications - No data to display  Initial Impression / Assessment and Plan / UC Course  I have reviewed the triage vital signs and the nursing notes.  Pertinent labs & imaging results that were available during my care of the patient were reviewed by me and considered in my medical decision making (see chart for details).     1.  Treatment options were discussed today with the patient. 2.  The patient wants to try the erythromycin ophthalmic ointment to apply to the right eye for 10 days. 3.  Continue to observe good hand hygiene.  Follow-up on as-needed basis if symptoms continue or worsen. Final Clinical Impressions(s) / UC Diagnoses   Final diagnoses:  Bacterial conjunctivitis     Discharge Instructions     -Use ophthalmic ointment as prescribed. -Practice good handwashing techniques and hygiene. -Follow-up as needed.   ED Prescriptions    Medication Sig Dispense Auth. Provider   erythromycin ophthalmic ointment Place into the right eye at bedtime for 10 days. Place a 1/2 inch ribbon of ointment into the lower eyelid. 1 g Lattie Corns, PA-C     Controlled Substance Prescriptions [place] Controlled Substance Registry  consulted? Not Applicable   Lattie Corns, PA-C 05/18/18 1912

## 2018-12-16 ENCOUNTER — Encounter: Payer: 59 | Admitting: Family Medicine

## 2019-02-17 ENCOUNTER — Encounter: Payer: Self-pay | Admitting: Family Medicine

## 2019-05-29 ENCOUNTER — Encounter: Payer: Self-pay | Admitting: Family Medicine

## 2019-06-26 ENCOUNTER — Encounter: Payer: Self-pay | Admitting: Family Medicine

## 2020-01-10 ENCOUNTER — Ambulatory Visit (INDEPENDENT_AMBULATORY_CARE_PROVIDER_SITE_OTHER): Payer: 59 | Admitting: Family Medicine

## 2020-01-10 ENCOUNTER — Encounter: Payer: Self-pay | Admitting: Family Medicine

## 2020-01-10 ENCOUNTER — Other Ambulatory Visit: Payer: Self-pay

## 2020-01-10 VITALS — BP 146/90 | HR 83 | Ht 74.0 in | Wt 254.0 lb

## 2020-01-10 DIAGNOSIS — K219 Gastro-esophageal reflux disease without esophagitis: Secondary | ICD-10-CM | POA: Diagnosis not present

## 2020-01-10 MED ORDER — PANTOPRAZOLE SODIUM 40 MG PO TBEC: 40.0000 mg | DELAYED_RELEASE_TABLET | Freq: Every day | ORAL | 3 refills | Status: DC

## 2020-01-10 NOTE — Patient Instructions (Signed)
Food Choices for Gastroesophageal Reflux Disease, Adult When you have gastroesophageal reflux disease (GERD), the foods you eat and your eating habits are very important. Choosing the right foods can help ease your discomfort. Think about working with a nutrition specialist (dietitian) to help you make good choices. What are tips for following this plan?  Meals  Choose healthy foods that are low in fat, such as fruits, vegetables, whole grains, low-fat dairy products, and lean meat, fish, and poultry.  Eat small meals often instead of 3 large meals a day. Eat your meals slowly, and in a place where you are relaxed. Avoid bending over or lying down until 2-3 hours after eating.  Avoid eating meals 2-3 hours before bed.  Avoid drinking a lot of liquid with meals.  Cook foods using methods other than frying. Bake, grill, or broil food instead.  Avoid or limit: ? Chocolate. ? Peppermint or spearmint. ? Alcohol. ? Pepper. ? Black and decaffeinated coffee. ? Black and decaffeinated tea. ? Bubbly (carbonated) soft drinks. ? Caffeinated energy drinks and soft drinks.  Limit high-fat foods such as: ? Fatty meat or fried foods. ? Whole milk, cream, butter, or ice cream. ? Nuts and nut butters. ? Pastries, donuts, and sweets made with butter or shortening.  Avoid foods that cause symptoms. These foods may be different for everyone. Common foods that cause symptoms include: ? Tomatoes. ? Oranges, lemons, and limes. ? Peppers. ? Spicy food. ? Onions and garlic. ? Vinegar. Lifestyle  Maintain a healthy weight. Ask your doctor what weight is healthy for you. If you need to lose weight, work with your doctor to do so safely.  Exercise for at least 30 minutes for 5 or more days each week, or as told by your doctor.  Wear loose-fitting clothes.  Do not smoke. If you need help quitting, ask your doctor.  Sleep with the head of your bed higher than your feet. Use a wedge under the  mattress or blocks under the bed frame to raise the head of the bed. Summary  When you have gastroesophageal reflux disease (GERD), food and lifestyle choices are very important in easing your symptoms.  Eat small meals often instead of 3 large meals a day. Eat your meals slowly, and in a place where you are relaxed.  Limit high-fat foods such as fatty meat or fried foods.  Avoid bending over or lying down until 2-3 hours after eating.  Avoid peppermint and spearmint, caffeine, alcohol, and chocolate. This information is not intended to replace advice given to you by your health care provider. Make sure you discuss any questions you have with your health care provider. Document Revised: 09/15/2018 Document Reviewed: 06/30/2016 Elsevier Patient Education  2020 Elsevier Inc.  Gastroesophageal Reflux Disease, Adult Gastroesophageal reflux (GER) happens when acid from the stomach flows up into the tube that connects the mouth and the stomach (esophagus). Normally, food travels down the esophagus and stays in the stomach to be digested. With GER, food and stomach acid sometimes move back up into the esophagus. You may have a disease called gastroesophageal reflux disease (GERD) if the reflux:  Happens often.  Causes frequent or very bad symptoms.  Causes problems such as damage to the esophagus. When this happens, the esophagus becomes sore and swollen (inflamed). Over time, GERD can make small holes (ulcers) in the lining of the esophagus. What are the causes? This condition is caused by a problem with the muscle between the esophagus and the stomach.   When this muscle is weak or not normal, it does not close properly to keep food and acid from coming back up from the stomach. The muscle can be weak because of:  Tobacco use.  Pregnancy.  Having a certain type of hernia (hiatal hernia).  Alcohol use.  Certain foods and drinks, such as coffee, chocolate, onions, and peppermint. What  increases the risk? You are more likely to develop this condition if you:  Are overweight.  Have a disease that affects your connective tissue.  Use NSAID medicines. What are the signs or symptoms? Symptoms of this condition include:  Heartburn.  Difficult or painful swallowing.  The feeling of having a lump in the throat.  A bitter taste in the mouth.  Bad breath.  Having a lot of saliva.  Having an upset or bloated stomach.  Belching.  Chest pain. Different conditions can cause chest pain. Make sure you see your doctor if you have chest pain.  Shortness of breath or noisy breathing (wheezing).  Ongoing (chronic) cough or a cough at night.  Wearing away of the surface of teeth (tooth enamel).  Weight loss. How is this treated? Treatment will depend on how bad your symptoms are. Your doctor may suggest:  Changes to your diet.  Medicine.  Surgery. Follow these instructions at home: Eating and drinking   Follow a diet as told by your doctor. You may need to avoid foods and drinks such as: ? Coffee and tea (with or without caffeine). ? Drinks that contain alcohol. ? Energy drinks and sports drinks. ? Bubbly (carbonated) drinks or sodas. ? Chocolate and cocoa. ? Peppermint and mint flavorings. ? Garlic and onions. ? Horseradish. ? Spicy and acidic foods. These include peppers, chili powder, curry powder, vinegar, hot sauces, and BBQ sauce. ? Citrus fruit juices and citrus fruits, such as oranges, lemons, and limes. ? Tomato-based foods. These include red sauce, chili, salsa, and pizza with red sauce. ? Fried and fatty foods. These include donuts, french fries, potato chips, and high-fat dressings. ? High-fat meats. These include hot dogs, rib eye steak, sausage, ham, and bacon. ? High-fat dairy items, such as whole milk, butter, and cream cheese.  Eat small meals often. Avoid eating large meals.  Avoid drinking large amounts of liquid with your  meals.  Avoid eating meals during the 2-3 hours before bedtime.  Avoid lying down right after you eat.  Do not exercise right after you eat. Lifestyle   Do not use any products that contain nicotine or tobacco. These include cigarettes, e-cigarettes, and chewing tobacco. If you need help quitting, ask your doctor.  Try to lower your stress. If you need help doing this, ask your doctor.  If you are overweight, lose an amount of weight that is healthy for you. Ask your doctor about a safe weight loss goal. General instructions  Pay attention to any changes in your symptoms.  Take over-the-counter and prescription medicines only as told by your doctor. Do not take aspirin, ibuprofen, or other NSAIDs unless your doctor says it is okay.  Wear loose clothes. Do not wear anything tight around your waist.  Raise (elevate) the head of your bed about 6 inches (15 cm).  Avoid bending over if this makes your symptoms worse.  Keep all follow-up visits as told by your doctor. This is important. Contact a doctor if:  You have new symptoms.  You lose weight and you do not know why.  You have trouble swallowing or it   hurts to swallow.  You have wheezing or a cough that keeps happening.  Your symptoms do not get better with treatment.  You have a hoarse voice. Get help right away if:  You have pain in your arms, neck, jaw, teeth, or back.  You feel sweaty, dizzy, or light-headed.  You have chest pain or shortness of breath.  You throw up (vomit) and your throw-up looks like blood or coffee grounds.  You pass out (faint).  Your poop (stool) is bloody or black.  You cannot swallow, drink, or eat. Summary  If a person has gastroesophageal reflux disease (GERD), food and stomach acid move back up into the esophagus and cause symptoms or problems such as damage to the esophagus.  Treatment will depend on how bad your symptoms are.  Follow a diet as told by your doctor.  Take  all medicines only as told by your doctor. This information is not intended to replace advice given to you by your health care provider. Make sure you discuss any questions you have with your health care provider. Document Revised: 12/01/2017 Document Reviewed: 12/01/2017 Elsevier Patient Education  2020 Elsevier Inc.  

## 2020-01-10 NOTE — Progress Notes (Signed)
Date:  01/10/2020   Name:  Curtis Rowe   DOB:  13-Feb-1961   MRN:  [id number]   Chief Complaint: Gastroesophageal Reflux (X15-16 years ago first happened, getting worse, cant sleep,nose burns, feels in ears, burping, acid comes up in throat  )  Gastroesophageal Reflux He complains of belching, choking, heartburn and water brash. He reports no abdominal pain, no chest pain, no coughing, no dysphagia, no early satiety, no globus sensation, no hoarse voice, no nausea, no sore throat, no stridor, no tooth decay or no wheezing. This is a chronic problem. The current episode started more than 1 year ago. The problem occurs frequently. The problem has been gradually worsening. The heartburn is located in the substernum. The symptoms are aggravated by certain foods and lying down. Pertinent negatives include no anemia, fatigue, melena, muscle weakness, orthopnea or weight loss. The treatment provided moderate relief.    Lab Results  Component Value Date   CREATININE 1.17 12/06/2017   BUN 14 12/06/2017   NA 140 12/06/2017   K 3.9 12/06/2017   CL 104 12/06/2017   CO2 24 12/06/2017   Lab Results  Component Value Date   CHOL 169 12/06/2017   HDL 53 12/06/2017   LDLCALC 103 (H) 12/06/2017   TRIG 64 12/06/2017   CHOLHDL 3.2 12/06/2017   Lab Results  Component Value Date   TSH 1.850 05/22/2008   No results found for: HGBA1C Lab Results  Component Value Date   WBC 6.2 05/22/2008   HGB 15.1 05/22/2008   HCT 44.5 05/22/2008   MCV 86.6 05/22/2008   PLT 236 05/22/2008   No results found for: ALT, AST, GGT, ALKPHOS, BILITOT   Review of Systems  Constitutional: Negative for chills, fatigue, fever and weight loss.  HENT: Negative for drooling, ear discharge, ear pain, hoarse voice and sore throat.   Respiratory: Positive for choking. Negative for cough, shortness of breath and wheezing.   Cardiovascular: Negative for chest pain, palpitations and leg swelling.  Gastrointestinal:  Positive for heartburn. Negative for abdominal pain, blood in stool, constipation, diarrhea, dysphagia, melena and nausea.  Endocrine: Negative for polydipsia.  Genitourinary: Negative for dysuria, frequency, hematuria and urgency.  Musculoskeletal: Negative for back pain, myalgias, muscle weakness and neck pain.  Skin: Negative for rash.  Allergic/Immunologic: Negative for environmental allergies.  Neurological: Negative for dizziness and headaches.  Hematological: Does not bruise/bleed easily.  Psychiatric/Behavioral: Negative for suicidal ideas. The patient is not nervous/anxious.     Patient Active Problem List   Diagnosis Date Noted  . Special screening for malignant neoplasms, colon   . Rectal polyp   . 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 Family history of early CAD 08/30/2015  . TINEA PEDIS 05/23/2008  . OBESITY, UNSPECIFIED 05/23/2008  . ERECTILE DYSFUNCTION 05/23/2008    No Known Allergies  Past Surgical History:  Procedure Laterality Date  . COLONOSCOPY WITH PROPOFOL N/A 02/04/2018   Procedure: COLONOSCOPY WITH PROPOFOL;  Surgeon: Midge Minium, MD;  Location: [facility];  Service: Endoscopy;  Laterality: N/A;  . ESOPHAGOGASTRODUODENOSCOPY    . HERNIA REPAIR    . POLYPECTOMY  02/04/2018   Procedure: POLYPECTOMY;  Surgeon: Midge Minium, MD;  Location: [facility];  Service: Endoscopy;;    Social History   Tobacco Use  . Smoking status: Current Some Day Smoker    Types: Cigars  . Smokeless tobacco: Never Used  . Tobacco comment: info given on pills and patches. 1 cigar/wk  Vaping Use  .  Vaping Use: Never used  Substance Use Topics  . Alcohol use: Yes    Alcohol/week: 1.0 standard drink    Types: 1 Shots of liquor per week  . Drug use: Never     Medication list has been reviewed and updated.  No outpatient medications have been marked as taking for the 01/10/20 encounter (Office Visit) with Duanne Limerick, MD.    [facility] 2/9 Scores  01/10/2020 11/26/2017  PHQ - 2 Score 1 0  PHQ- 9 Score 4 2    GAD 7 : Generalized Anxiety Score 01/10/2020  Nervous, Anxious, on Edge 0  Control/stop worrying 1  Worry too much - different things 1  Trouble relaxing 2  Restless 0  Easily annoyed or irritable 0  Afraid - awful might happen 0  Total GAD 7 Score 4  Anxiety Difficulty Not difficult at all    BP Readings from Last 3 Encounters:  01/10/20 (!) 146/90  05/18/18 (!) 162/100  02/04/18 133/90    Physical Exam Vitals and nursing note reviewed.  HENT:     Head: Normocephalic.     Right Ear: Tympanic membrane, ear canal and external ear normal. There is no impacted cerumen.     Left Ear: Tympanic membrane, ear canal and external ear normal. There is no impacted cerumen.     Nose: Nose normal. No congestion or rhinorrhea.  Eyes:     General: No scleral icterus.       Right eye: No discharge.        Left eye: No discharge.     Conjunctiva/sclera: Conjunctivae normal.     Pupils: Pupils are equal, round, and reactive to light.  Neck:     Thyroid: No thyromegaly.     Vascular: No JVD.     Trachea: No tracheal deviation.  Cardiovascular:     Rate and Rhythm: Normal rate and regular rhythm.     Heart sounds: Normal heart sounds. No murmur heard.  No friction rub. No gallop.   Pulmonary:     Effort: No respiratory distress.     Breath sounds: Normal breath sounds. No wheezing or rales.  Abdominal:     General: Bowel sounds are normal.     Palpations: Abdomen is soft. There is no hepatomegaly, splenomegaly or mass.     Tenderness: There is no abdominal tenderness. There is no guarding or rebound.  Musculoskeletal:        General: No tenderness. Normal range of motion.     Cervical back: Normal range of motion and neck supple.  Lymphadenopathy:     Cervical: No cervical adenopathy.  Skin:    General: Skin is warm.     Findings: No rash.  Neurological:     Mental Status: He is alert and oriented to person, place, and  time.     Cranial Nerves: No cranial nerve deficit.     Deep Tendon Reflexes: Reflexes are normal and symmetric.     Wt Readings from Last 3 Encounters:  01/10/20 254 lb (115.2 kg)  05/18/18 235 lb (106.6 kg)  02/04/18 241 lb (109.3 kg)    BP (!) 146/90   Pulse 83   Ht 6\' 2"  (1.88 m)   Wt 254 lb (115.2 kg)   SpO2 98%   BMI 32.61 kg/m   Assessment and Plan: 1. Gastroesophageal reflux disease without esophagitis Relatively new onset more so recurrence.  Uncontrolled.  Uncomplicated.  Patient has primarily symptoms when he lays down to sleep.  Patient  used to be on pantoprazole and is currently off medication.  We will resume pantoprazole 40 mg once a day patient has been given dietary approach to GERD.  Patient has also been suggested to use a 2 x 4 under the headboard to give a slight incline when he sleeps.  Patient is to return if this is unresolved.

## 2020-04-09 ENCOUNTER — Telehealth: Payer: Self-pay | Admitting: Family Medicine

## 2020-04-09 ENCOUNTER — Other Ambulatory Visit: Payer: Self-pay

## 2020-04-09 DIAGNOSIS — K219 Gastro-esophageal reflux disease without esophagitis: Secondary | ICD-10-CM

## 2020-04-09 MED ORDER — OMEPRAZOLE 20 MG PO CPDR: 20.0000 mg | DELAYED_RELEASE_CAPSULE | Freq: Every day | ORAL | 3 refills | Status: DC

## 2020-04-09 NOTE — Telephone Encounter (Signed)
Per previous encounter, "Patient states pantoprazole (PROTONIX) 40 MG tablet is no longer covered under his insurance and would like PCP to prescribe an alternate today. Patient states he would rather not pay $40 out of pocket cost. Please advise  [id number] [facility], [place] - [address] AT [facility] OF [address] & [facility] Phone:  [phone]  Fax:  [phone]     The pt is seen by Dr Elizabeth Sauer, [facility]; will route to office for final disposition

## 2020-04-09 NOTE — Telephone Encounter (Signed)
Patient states pantoprazole (PROTONIX) 40 MG tablet is no longer covered under his insurance and would like PCP to prescribe an alternate today. Patient states he would rather not pay $40 out of pocket cost. Please advise  [facility] #[id number] [facility], [place] - [address] AT [facility] OF [address] & [facility] [address] Phone:  [phone]  Fax:  [phone]

## 2020-04-09 NOTE — Telephone Encounter (Signed)
Sent in Omeprazole to [facility]- if they don't pay for that, he will have to buy otc med. Please call him and let him know this

## 2020-04-09 NOTE — Telephone Encounter (Signed)
Pt is aware of the below message and that omeprazole was sent in to his pharmacy

## 2020-04-09 NOTE — Progress Notes (Unsigned)
Changed med to omeprazole due to ins

## 2020-04-18 ENCOUNTER — Encounter: Payer: Self-pay | Admitting: Family Medicine

## 2020-04-18 ENCOUNTER — Telehealth: Payer: Self-pay

## 2020-04-18 ENCOUNTER — Encounter: Payer: 59 | Admitting: Family Medicine

## 2020-04-18 ENCOUNTER — Other Ambulatory Visit: Payer: Self-pay

## 2020-04-18 NOTE — Telephone Encounter (Signed)
I called pt back to room and was going over his meds. I noticed he had 2 meds that were just prescribed 2 days ago from the [place] in [place]. I asked the pt who the doctor was. He stated the doctor was a primary care. I explained to him he couldn't have 2 open PCPs. He asked what should he do? So I gave him the example- "If you were to have a knee surgery done, would you go to [place] or somewhere else/" He stated, [facility]." Therefore, I advised him he should keep [facility] because they would be able to help him with things that may come up in the future. Pt voiced understanding and stated that was "fine with me." Patient left without being seen.

## 2021-06-16 ENCOUNTER — Other Ambulatory Visit: Payer: Self-pay

## 2021-06-16 ENCOUNTER — Encounter: Payer: Self-pay | Admitting: Family Medicine

## 2021-06-16 ENCOUNTER — Ambulatory Visit: Payer: 59 | Admitting: Family Medicine

## 2021-06-16 VITALS — BP 138/90 | HR 60 | Ht 74.0 in | Wt 259.0 lb

## 2021-06-16 DIAGNOSIS — K219 Gastro-esophageal reflux disease without esophagitis: Secondary | ICD-10-CM

## 2021-06-16 MED ORDER — PANTOPRAZOLE SODIUM 40 MG PO TBEC: 40.0000 mg | DELAYED_RELEASE_TABLET | Freq: Every day | ORAL | 3 refills | Status: AC

## 2021-06-16 NOTE — Progress Notes (Signed)
Date:  06/16/2021   Name:  Curtis Rowe   DOB:  Jan 04, 1961   MRN:  [id number]   Chief Complaint: Gastroesophageal Reflux (Follow up GERD- started pantoprazole)  Gastroesophageal Reflux He complains of heartburn. He reports no abdominal pain, no belching, no chest pain, no choking, no coughing, no dysphagia, no early satiety, no globus sensation, no hoarse voice, no nausea, no sore throat, no stridor, no tooth decay, no water brash or no wheezing. This is a recurrent problem. The current episode started more than 1 month ago. The problem occurs frequently. The problem has been waxing and waning. The heartburn is located in the substernum. The heartburn is of mild intensity. The symptoms are aggravated by certain foods.   Lab Results  Component Value Date   NA 140 12/06/2017   K 3.9 12/06/2017   CO2 24 12/06/2017   GLUCOSE 81 12/06/2017   BUN 14 12/06/2017   CREATININE 1.17 12/06/2017   CALCIUM 8.9 12/06/2017   GFRNONAA 70 12/06/2017   Lab Results  Component Value Date   CHOL 169 12/06/2017   HDL 53 12/06/2017   LDLCALC 103 (H) 12/06/2017   TRIG 64 12/06/2017   CHOLHDL 3.2 12/06/2017   Lab Results  Component Value Date   TSH 1.850 05/22/2008   No results found for: HGBA1C Lab Results  Component Value Date   WBC 6.2 05/22/2008   HGB 15.1 05/22/2008   HCT 44.5 05/22/2008   MCV 86.6 05/22/2008   PLT 236 05/22/2008   No results found for: ALT, AST, GGT, ALKPHOS, BILITOT No results found for: 25OHVITD2, 25OHVITD3, VD25OH   Review of Systems  Constitutional:  Negative for chills and fever.  HENT:  Negative for drooling, ear discharge, ear pain, hoarse voice and sore throat.   Respiratory:  Negative for cough, choking, shortness of breath and wheezing.   Cardiovascular:  Negative for chest pain, palpitations and leg swelling.  Gastrointestinal:  Positive for heartburn. Negative for abdominal pain, blood in stool, constipation, diarrhea, dysphagia and nausea.   Endocrine: Negative for polydipsia.  Genitourinary:  Negative for dysuria, frequency, hematuria and urgency.  Musculoskeletal:  Negative for back pain, myalgias and neck pain.  Skin:  Negative for rash.  Allergic/Immunologic: Negative for environmental allergies.  Neurological:  Negative for dizziness and headaches.  Hematological:  Does not bruise/bleed easily.  Psychiatric/Behavioral:  Negative for suicidal ideas. The patient is not nervous/anxious.    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   Family history of early CAD 08/30/2015   TINEA PEDIS 05/23/2008   OBESITY, UNSPECIFIED 05/23/2008   ERECTILE DYSFUNCTION 05/23/2008    No Known Allergies  Past Surgical History:  Procedure Laterality Date   COLONOSCOPY WITH PROPOFOL N/A 02/04/2018   Procedure: COLONOSCOPY WITH PROPOFOL;  Surgeon: Lucilla Lame, MD;  Location: [place];  Service: Endoscopy;  Laterality: N/A;   ESOPHAGOGASTRODUODENOSCOPY     HERNIA REPAIR     POLYPECTOMY  02/04/2018   Procedure: POLYPECTOMY;  Surgeon: Lucilla Lame, MD;  Location: [facility];  Service: Endoscopy;;    Social History   Tobacco Use   Smoking status: Some Days    Types: Cigars   Smokeless tobacco: Never   Tobacco comments:    info given on pills and patches. 1 cigar/wk  Vaping Use   Vaping Use: Never used  Substance Use Topics  Alcohol use: Yes    Alcohol/week: 1.0 standard drink    Types: 1 Shots of liquor per week   Drug use: Never     Medication list has been reviewed and updated.  Current Meds  Medication Sig   pantoprazole (PROTONIX) 20 MG tablet Take 20 mg by mouth daily.    PHQ 2/9 Scores 06/16/2021 01/10/2020 11/26/2017  PHQ - 2 Score 0 1 0  PHQ- 9 Score - 4 2    GAD 7 : Generalized Anxiety Score 01/10/2020  Nervous, Anxious, on Edge 0  Control/stop worrying 1  Worry too much -  different things 1  Trouble relaxing 2  Restless 0  Easily annoyed or irritable 0  Afraid - awful might happen 0  Total GAD 7 Score 4  Anxiety Difficulty Not difficult at all    BP Readings from Last 3 Encounters:  06/16/21 138/90  01/10/20 (!) 146/90  05/18/18 (!) 162/100    Physical Exam Vitals and nursing note reviewed.  HENT:     Right Ear: Tympanic membrane and ear canal normal.     Left Ear: Tympanic membrane and ear canal normal.     Mouth/Throat:     Pharynx: No oropharyngeal exudate or posterior oropharyngeal erythema.  Cardiovascular:     Heart sounds: No murmur heard.   No friction rub. No gallop.  Pulmonary:     Effort: Pulmonary effort is normal.     Breath sounds: Normal breath sounds. No wheezing, rhonchi or rales.  Abdominal:     General: Abdomen is flat. There is no distension.     Palpations: There is no mass.     Tenderness: There is no abdominal tenderness.     Hernia: No hernia is present.  Skin:    Capillary Refill: Capillary refill takes less than 2 seconds.    Wt Readings from Last 3 Encounters:  06/16/21 259 lb (117.5 kg)  04/18/20 263 lb (119.3 kg)  01/10/20 254 lb (115.2 kg)    BP 138/90    Pulse 60    Ht 6\' 2"  (1.88 m)    Wt 259 lb (117.5 kg)    BMI 33.25 kg/m   Assessment and Plan:  1. Gastroesophageal reflux disease without esophagitis New onset.  Waxes and wanes.  Partially treated with pantoprazole 20 mg.  Patient has been instructed to double his dose of pantoprazole and we will prescribe it at 40 mg at least for a period of time until he is seen at the [place] to see if this is maintained or if any further evaluation is to be done. - pantoprazole (PROTONIX) 40 MG tablet; Take 1 tablet (40 mg total) by mouth daily.  Dispense: 30 tablet; Refill: 3   We did discuss the importance of him taking his fluoxetine on a daily basis rather on than on an as needed.  We also discussed the [facility]  [facility] we will be [facility] the CPAP machine and tubing and other instrumentation.
